# Patient Record
Sex: Female | Born: 1935 | Race: White | Hispanic: No | Smoking: Never smoker
Health system: Southern US, Community
[De-identification: ages and names within clinical notes are randomized; demographics above are authoritative.]

## PROBLEM LIST (undated history)

## (undated) DIAGNOSIS — K219 Gastro-esophageal reflux disease without esophagitis: Secondary | ICD-10-CM

## (undated) DIAGNOSIS — N289 Disorder of kidney and ureter, unspecified: Secondary | ICD-10-CM

## (undated) DIAGNOSIS — I10 Essential (primary) hypertension: Secondary | ICD-10-CM

## (undated) DIAGNOSIS — D649 Anemia, unspecified: Secondary | ICD-10-CM

## (undated) DIAGNOSIS — F419 Anxiety disorder, unspecified: Secondary | ICD-10-CM

## (undated) DIAGNOSIS — K2270 Barrett's esophagus without dysplasia: Secondary | ICD-10-CM

---

## 2003-06-15 ENCOUNTER — Other Ambulatory Visit: Payer: Self-pay

## 2005-03-11 ENCOUNTER — Ambulatory Visit: Payer: Self-pay | Admitting: Family Medicine

## 2007-07-24 ENCOUNTER — Emergency Department: Payer: Self-pay | Admitting: Internal Medicine

## 2007-07-24 ENCOUNTER — Other Ambulatory Visit: Payer: Self-pay

## 2007-07-30 ENCOUNTER — Inpatient Hospital Stay: Payer: Self-pay | Admitting: Specialist

## 2007-07-30 ENCOUNTER — Other Ambulatory Visit: Payer: Self-pay

## 2011-09-17 ENCOUNTER — Encounter: Payer: Self-pay | Admitting: Nurse Practitioner

## 2011-09-17 ENCOUNTER — Encounter: Payer: Self-pay | Admitting: Cardiothoracic Surgery

## 2012-02-27 DIAGNOSIS — F411 Generalized anxiety disorder: Secondary | ICD-10-CM

## 2012-02-27 DIAGNOSIS — K2270 Barrett's esophagus without dysplasia: Secondary | ICD-10-CM

## 2012-02-27 DIAGNOSIS — M159 Polyosteoarthritis, unspecified: Secondary | ICD-10-CM

## 2012-02-27 DIAGNOSIS — I10 Essential (primary) hypertension: Secondary | ICD-10-CM

## 2012-03-11 DIAGNOSIS — H6120 Impacted cerumen, unspecified ear: Secondary | ICD-10-CM

## 2012-03-23 ENCOUNTER — Telehealth: Payer: Self-pay

## 2012-03-23 NOTE — Telephone Encounter (Signed)
I reviewed this at [facility] this morning and this is appropriate Rx

## 2012-03-23 NOTE — Telephone Encounter (Signed)
Triage Record Num: [id number] Operator: Baltazar Apo Patient Name: Cotina Freedman Call Date & Time: 03/22/2012 3:42:39PM Patient Phone: [phone] PCP: Patient Gender: Female PCP Fax : Patient DOB: 1910/12/08 [facility] Name: [facility] Reason for Call: Caller: Betsy/RN; PCP: Ruthe Mannan (Family Practice); CB#: [phone]; Call regarding Requesting orders; Facility reporting UA results with > 100,000 bacteria present 12/1, Afebrile, Not reporting any urinary tract symptoms, Per standing orders Ceftin 250mg  1 PO BID x 7 days given to [facility], nurse at facility and cover with provider in the morning. Protocol(s) Used: Office Note Recommended Outcome per Protocol: Information Noted and Sent to Office Reason for Outcome: Caller information to office Care Advice: ~

## 2012-03-26 DIAGNOSIS — F411 Generalized anxiety disorder: Secondary | ICD-10-CM

## 2012-03-26 DIAGNOSIS — M159 Polyosteoarthritis, unspecified: Secondary | ICD-10-CM

## 2012-03-26 DIAGNOSIS — K2270 Barrett's esophagus without dysplasia: Secondary | ICD-10-CM

## 2012-03-26 DIAGNOSIS — I10 Essential (primary) hypertension: Secondary | ICD-10-CM

## 2012-03-30 DIAGNOSIS — R300 Dysuria: Secondary | ICD-10-CM

## 2012-04-30 DIAGNOSIS — K2270 Barrett's esophagus without dysplasia: Secondary | ICD-10-CM

## 2012-04-30 DIAGNOSIS — I10 Essential (primary) hypertension: Secondary | ICD-10-CM

## 2012-04-30 DIAGNOSIS — M159 Polyosteoarthritis, unspecified: Secondary | ICD-10-CM

## 2012-04-30 DIAGNOSIS — F411 Generalized anxiety disorder: Secondary | ICD-10-CM

## 2012-05-21 DIAGNOSIS — R350 Frequency of micturition: Secondary | ICD-10-CM

## 2012-05-22 DIAGNOSIS — N3000 Acute cystitis without hematuria: Secondary | ICD-10-CM

## 2012-05-28 DIAGNOSIS — M159 Polyosteoarthritis, unspecified: Secondary | ICD-10-CM

## 2012-05-28 DIAGNOSIS — F411 Generalized anxiety disorder: Secondary | ICD-10-CM

## 2012-05-28 DIAGNOSIS — N318 Other neuromuscular dysfunction of bladder: Secondary | ICD-10-CM

## 2012-05-28 DIAGNOSIS — I10 Essential (primary) hypertension: Secondary | ICD-10-CM

## 2012-06-18 DIAGNOSIS — M6281 Muscle weakness (generalized): Secondary | ICD-10-CM

## 2012-06-18 DIAGNOSIS — N318 Other neuromuscular dysfunction of bladder: Secondary | ICD-10-CM

## 2012-06-25 DIAGNOSIS — J069 Acute upper respiratory infection, unspecified: Secondary | ICD-10-CM

## 2012-07-30 DIAGNOSIS — N318 Other neuromuscular dysfunction of bladder: Secondary | ICD-10-CM

## 2012-07-30 DIAGNOSIS — K2270 Barrett's esophagus without dysplasia: Secondary | ICD-10-CM

## 2012-07-30 DIAGNOSIS — F411 Generalized anxiety disorder: Secondary | ICD-10-CM

## 2012-07-30 DIAGNOSIS — I10 Essential (primary) hypertension: Secondary | ICD-10-CM

## 2012-08-25 DIAGNOSIS — R3915 Urgency of urination: Secondary | ICD-10-CM

## 2012-08-28 DIAGNOSIS — F411 Generalized anxiety disorder: Secondary | ICD-10-CM

## 2012-09-23 ENCOUNTER — Ambulatory Visit: Payer: Self-pay | Admitting: Internal Medicine

## 2012-10-01 DIAGNOSIS — F411 Generalized anxiety disorder: Secondary | ICD-10-CM

## 2012-10-01 DIAGNOSIS — K2270 Barrett's esophagus without dysplasia: Secondary | ICD-10-CM

## 2012-10-01 DIAGNOSIS — N318 Other neuromuscular dysfunction of bladder: Secondary | ICD-10-CM

## 2012-10-01 DIAGNOSIS — I10 Essential (primary) hypertension: Secondary | ICD-10-CM

## 2012-10-19 DIAGNOSIS — L570 Actinic keratosis: Secondary | ICD-10-CM

## 2012-10-29 DIAGNOSIS — F329 Major depressive disorder, single episode, unspecified: Secondary | ICD-10-CM

## 2012-12-03 DIAGNOSIS — F411 Generalized anxiety disorder: Secondary | ICD-10-CM

## 2012-12-03 DIAGNOSIS — I10 Essential (primary) hypertension: Secondary | ICD-10-CM

## 2012-12-03 DIAGNOSIS — N318 Other neuromuscular dysfunction of bladder: Secondary | ICD-10-CM

## 2012-12-03 DIAGNOSIS — K2270 Barrett's esophagus without dysplasia: Secondary | ICD-10-CM

## 2012-12-22 DIAGNOSIS — F411 Generalized anxiety disorder: Secondary | ICD-10-CM

## 2012-12-22 DIAGNOSIS — R443 Hallucinations, unspecified: Secondary | ICD-10-CM

## 2013-01-20 DIAGNOSIS — F411 Generalized anxiety disorder: Secondary | ICD-10-CM

## 2013-01-20 DIAGNOSIS — N318 Other neuromuscular dysfunction of bladder: Secondary | ICD-10-CM

## 2013-01-20 DIAGNOSIS — M1990 Unspecified osteoarthritis, unspecified site: Secondary | ICD-10-CM

## 2013-01-20 DIAGNOSIS — K2270 Barrett's esophagus without dysplasia: Secondary | ICD-10-CM

## 2013-03-09 DIAGNOSIS — R443 Hallucinations, unspecified: Secondary | ICD-10-CM

## 2013-03-25 DIAGNOSIS — I10 Essential (primary) hypertension: Secondary | ICD-10-CM

## 2013-03-25 DIAGNOSIS — F411 Generalized anxiety disorder: Secondary | ICD-10-CM

## 2013-03-25 DIAGNOSIS — K2270 Barrett's esophagus without dysplasia: Secondary | ICD-10-CM

## 2013-03-25 DIAGNOSIS — N318 Other neuromuscular dysfunction of bladder: Secondary | ICD-10-CM

## 2013-05-25 DIAGNOSIS — IMO0002 Reserved for concepts with insufficient information to code with codable children: Secondary | ICD-10-CM

## 2013-05-25 DIAGNOSIS — I10 Essential (primary) hypertension: Secondary | ICD-10-CM

## 2013-05-25 DIAGNOSIS — F411 Generalized anxiety disorder: Secondary | ICD-10-CM

## 2013-05-25 DIAGNOSIS — N318 Other neuromuscular dysfunction of bladder: Secondary | ICD-10-CM

## 2013-05-25 DIAGNOSIS — M1710 Unilateral primary osteoarthritis, unspecified knee: Secondary | ICD-10-CM

## 2013-05-25 DIAGNOSIS — K2270 Barrett's esophagus without dysplasia: Secondary | ICD-10-CM

## 2013-07-26 DIAGNOSIS — N318 Other neuromuscular dysfunction of bladder: Secondary | ICD-10-CM

## 2013-07-26 DIAGNOSIS — F411 Generalized anxiety disorder: Secondary | ICD-10-CM

## 2013-07-26 DIAGNOSIS — I10 Essential (primary) hypertension: Secondary | ICD-10-CM

## 2013-07-26 DIAGNOSIS — K2270 Barrett's esophagus without dysplasia: Secondary | ICD-10-CM

## 2013-08-16 DIAGNOSIS — R443 Hallucinations, unspecified: Secondary | ICD-10-CM

## 2013-09-21 DIAGNOSIS — M1710 Unilateral primary osteoarthritis, unspecified knee: Secondary | ICD-10-CM

## 2013-09-21 DIAGNOSIS — IMO0002 Reserved for concepts with insufficient information to code with codable children: Secondary | ICD-10-CM

## 2013-09-21 DIAGNOSIS — M810 Age-related osteoporosis without current pathological fracture: Secondary | ICD-10-CM

## 2013-09-21 DIAGNOSIS — I10 Essential (primary) hypertension: Secondary | ICD-10-CM

## 2013-09-21 DIAGNOSIS — N318 Other neuromuscular dysfunction of bladder: Secondary | ICD-10-CM

## 2013-09-21 DIAGNOSIS — K2270 Barrett's esophagus without dysplasia: Secondary | ICD-10-CM

## 2013-09-21 DIAGNOSIS — F411 Generalized anxiety disorder: Secondary | ICD-10-CM

## 2013-10-18 DIAGNOSIS — R443 Hallucinations, unspecified: Secondary | ICD-10-CM

## 2013-11-07 ENCOUNTER — Observation Stay: Payer: Self-pay | Admitting: Orthopedic Surgery

## 2013-11-07 LAB — BASIC METABOLIC PANEL
Anion Gap: 6 — ABNORMAL LOW (ref 7–16)
BUN: 26 mg/dL — ABNORMAL HIGH (ref 7–18)
CREATININE: 1.37 mg/dL — AB (ref 0.60–1.30)
Calcium, Total: 8.3 mg/dL — ABNORMAL LOW (ref 8.5–10.1)
Chloride: 108 mmol/L — ABNORMAL HIGH (ref 98–107)
Co2: 27 mmol/L (ref 21–32)
EGFR (African American): 36 — ABNORMAL LOW
GFR CALC NON AF AMER: 31 — AB
GLUCOSE: 105 mg/dL — AB (ref 65–99)
Osmolality: 286 (ref 275–301)
Potassium: 5.1 mmol/L (ref 3.5–5.1)
Sodium: 141 mmol/L (ref 136–145)

## 2013-11-07 LAB — URINALYSIS, COMPLETE
Bilirubin,UR: NEGATIVE
Glucose,UR: NEGATIVE mg/dL (ref 0–75)
Ketone: NEGATIVE
Nitrite: NEGATIVE
PROTEIN: NEGATIVE
Ph: 6 (ref 4.5–8.0)
RBC,UR: 117 /HPF (ref 0–5)
SPECIFIC GRAVITY: 1.011 (ref 1.003–1.030)
SQUAMOUS EPITHELIAL: NONE SEEN

## 2013-11-07 LAB — CBC
HCT: 38.8 % — AB (ref 40.0–52.0)
HGB: 12.2 g/dL (ref 12.0–16.0)
MCH: 29 pg (ref 26.0–34.0)
MCHC: 31.5 g/dL — AB (ref 32.0–36.0)
MCV: 92 fL (ref 80–100)
PLATELETS: 127 10*3/uL — AB (ref 150–440)
RBC: 4.21 10*6/uL — ABNORMAL LOW (ref 4.40–5.90)
RDW: 14.7 % — AB (ref 11.5–14.5)
WBC: 7.8 10*3/uL (ref 3.8–10.6)

## 2013-11-08 LAB — BASIC METABOLIC PANEL
Anion Gap: 11 (ref 7–16)
BUN: 30 mg/dL — AB (ref 7–18)
CALCIUM: 8.9 mg/dL (ref 8.5–10.1)
Chloride: 107 mmol/L (ref 98–107)
Co2: 24 mmol/L (ref 21–32)
Creatinine: 1.4 mg/dL — ABNORMAL HIGH (ref 0.60–1.30)
EGFR (Non-African Amer.): 31 — ABNORMAL LOW
GFR CALC AF AMER: 35 — AB
GLUCOSE: 137 mg/dL — AB (ref 65–99)
OSMOLALITY: 291 (ref 275–301)
POTASSIUM: 4.4 mmol/L (ref 3.5–5.1)
Sodium: 142 mmol/L (ref 136–145)

## 2013-11-10 LAB — URINE CULTURE

## 2013-11-11 DIAGNOSIS — S82899A Other fracture of unspecified lower leg, initial encounter for closed fracture: Secondary | ICD-10-CM

## 2013-11-19 DIAGNOSIS — T783XXA Angioneurotic edema, initial encounter: Secondary | ICD-10-CM

## 2013-11-23 DIAGNOSIS — K2270 Barrett's esophagus without dysplasia: Secondary | ICD-10-CM

## 2013-11-23 DIAGNOSIS — R221 Localized swelling, mass and lump, neck: Secondary | ICD-10-CM

## 2013-11-23 DIAGNOSIS — F411 Generalized anxiety disorder: Secondary | ICD-10-CM

## 2013-11-23 DIAGNOSIS — M1710 Unilateral primary osteoarthritis, unspecified knee: Secondary | ICD-10-CM

## 2013-11-23 DIAGNOSIS — R443 Hallucinations, unspecified: Secondary | ICD-10-CM

## 2013-11-23 DIAGNOSIS — R220 Localized swelling, mass and lump, head: Secondary | ICD-10-CM

## 2013-11-23 DIAGNOSIS — IMO0002 Reserved for concepts with insufficient information to code with codable children: Secondary | ICD-10-CM

## 2013-11-23 DIAGNOSIS — I10 Essential (primary) hypertension: Secondary | ICD-10-CM

## 2013-12-21 DIAGNOSIS — F411 Generalized anxiety disorder: Secondary | ICD-10-CM

## 2013-12-21 DIAGNOSIS — F22 Delusional disorders: Secondary | ICD-10-CM

## 2014-01-20 DIAGNOSIS — S82102S Unspecified fracture of upper end of left tibia, sequela: Secondary | ICD-10-CM

## 2014-01-20 DIAGNOSIS — F419 Anxiety disorder, unspecified: Secondary | ICD-10-CM

## 2014-01-20 DIAGNOSIS — K2270 Barrett's esophagus without dysplasia: Secondary | ICD-10-CM

## 2014-01-20 DIAGNOSIS — F29 Unspecified psychosis not due to a substance or known physiological condition: Secondary | ICD-10-CM

## 2014-01-20 DIAGNOSIS — I10 Essential (primary) hypertension: Secondary | ICD-10-CM

## 2014-01-20 DIAGNOSIS — M150 Primary generalized (osteo)arthritis: Secondary | ICD-10-CM

## 2014-03-23 DIAGNOSIS — F419 Anxiety disorder, unspecified: Secondary | ICD-10-CM

## 2014-03-23 DIAGNOSIS — K2270 Barrett's esophagus without dysplasia: Secondary | ICD-10-CM

## 2014-03-23 DIAGNOSIS — S82302A Unspecified fracture of lower end of left tibia, initial encounter for closed fracture: Secondary | ICD-10-CM

## 2014-03-23 DIAGNOSIS — I10 Essential (primary) hypertension: Secondary | ICD-10-CM

## 2014-03-23 DIAGNOSIS — F600 Paranoid personality disorder: Secondary | ICD-10-CM

## 2014-03-23 DIAGNOSIS — M1990 Unspecified osteoarthritis, unspecified site: Secondary | ICD-10-CM

## 2014-05-03 DIAGNOSIS — L858 Other specified epidermal thickening: Secondary | ICD-10-CM

## 2014-05-30 DIAGNOSIS — M150 Primary generalized (osteo)arthritis: Secondary | ICD-10-CM

## 2014-05-30 DIAGNOSIS — F411 Generalized anxiety disorder: Secondary | ICD-10-CM

## 2014-05-30 DIAGNOSIS — K2270 Barrett's esophagus without dysplasia: Secondary | ICD-10-CM

## 2014-05-30 DIAGNOSIS — F29 Unspecified psychosis not due to a substance or known physiological condition: Secondary | ICD-10-CM

## 2014-05-30 DIAGNOSIS — I10 Essential (primary) hypertension: Secondary | ICD-10-CM

## 2014-06-21 DIAGNOSIS — L03019 Cellulitis of unspecified finger: Secondary | ICD-10-CM | POA: Diagnosis not present

## 2014-06-29 DIAGNOSIS — L03019 Cellulitis of unspecified finger: Secondary | ICD-10-CM | POA: Diagnosis not present

## 2014-06-30 DIAGNOSIS — T7840XA Allergy, unspecified, initial encounter: Secondary | ICD-10-CM | POA: Diagnosis not present

## 2014-07-26 DIAGNOSIS — M1990 Unspecified osteoarthritis, unspecified site: Secondary | ICD-10-CM | POA: Diagnosis not present

## 2014-07-26 DIAGNOSIS — I10 Essential (primary) hypertension: Secondary | ICD-10-CM | POA: Diagnosis not present

## 2014-07-26 DIAGNOSIS — K219 Gastro-esophageal reflux disease without esophagitis: Secondary | ICD-10-CM | POA: Diagnosis not present

## 2014-07-26 DIAGNOSIS — F600 Paranoid personality disorder: Secondary | ICD-10-CM | POA: Diagnosis not present

## 2014-07-26 DIAGNOSIS — F419 Anxiety disorder, unspecified: Secondary | ICD-10-CM

## 2014-08-04 DIAGNOSIS — L03114 Cellulitis of left upper limb: Secondary | ICD-10-CM | POA: Diagnosis not present

## 2014-08-13 NOTE — Discharge Summary (Signed)
PATIENT NAME:  Vanessa Glass, Vanessa Glass MR#:  [id number] DATE OF BIRTH:  1910/07/31  DATE OF ADMISSION:  11/07/2013 DATE OF DISCHARGE:    REASON FOR ADMISSION:  Left bimalleolar ankle fracture.    HISTORY OF PRESENT ILLNESS:  The patient is a 81 year old female who fell at [facility] which is her home.  The fall was reported to have recurred the morning of admission.  The patient was unable to stand after injury.  She was seen in the ER with her daughter and son-in-law.  The patient denies other injuries.  She is hypertensive and her daughter reports that she had not taken her regular medications on admission.   PAST MEDICAL HISTORY:  Includes macular degeneration, hypertension, hypercholesterolemia, GERD, appendectomy and cholecystectomy.   ALLERGIES:  PENICILLIN, SULFA AND MACRODANTIN.   HOME MEDICATIONS:  Include Cipro, MiraLAX, vitamin C, Celexa, atenolol, Centrum Silver, vitamin D3, enteric-coated aspirin 81 mg, Timolol ophthalmic 0.5 ophthalmic solution, Seroquel 25 mg, Protonix 40 mg, PreserVision antioxidant multivitamins, Tylenol Arthritis, Xanax, Xalatan ophthalmic solution, Debrox, Allegra, artificial tears.  FAMILY HISTORY:  Noncontributory.   HOSPITAL COURSE:  The patient was admitted on 11/07/2013 after a successful closed reduction and splinting of the left ankle.  The patient was admitted for neurovascular monitoring.  The patient was noted to have systolic blood pressure of over 200 in the ER.  Hospitalist service was consulted.  They recommended the patient get back on her home medications for hypertension.  On hospital day #1, the patient was vomiting.  She had foul-smelling urine and was found to have a UTI.  The patient was started on Levaquin by the hospitalist service.  The patient got a KUB for the nausea and vomiting and felt to have possible ileus and was given an NG tube.  She was kept nothing by mouth.  By hospital day 2, the patient was no longer vomiting.  The NG tube was  removed.  She had a bowel movement and was found to have a better appearance on her KUB by the medical service.  The patient had a Foley placed and as she was having trouble voiding the medical service recommended that she keep an indwelling catheter.  The patient was also requiring supplemental O2 with nasal cannula.  The medical service recommended that she continue using this while at her facility to keep saturations greater than 88%.  In regards to her balance, the medical service recommended the patient be discharged on bowel regimen she was currently and have a gentle diet as she can tolerate.  The patient was also seen by palliative care.  The patient is a DO NOT RESUSCITATE status.   DISCHARGE INSTRUCTIONS:  The patient will be discharged with instructions to continue to elevate the left lower extremity.  She may be touchdown weight-bearing only on the left lower extremity.  She needs to use a walker for transfers and should primarily use wheelchair for transportation.  She will require assistance with transfers.  The patient will follow up with [facility] in 7 to 10 days for skin check and conversion from a splint to a short leg cast.  She may make a follow up with her primary care physician regarding her constipation and recent UTI.  Given the patient's age and high risk falling, I am not recommending that she be placed on an anticoagulant.  She also is not requiring any narcotics for pain and may use Tylenol.   DISCHARGE DIAGNOSES: 1.  Left bimalleolar ankle fracture.  2.  Urinary tract infection.  3.  Malignant hypertension.  4.  Constipation.    ____________________________ Kathreen DevoidKevin L. Mckennah Kretchmer, MD klk:ea D: 11/09/2013 17:15:36 ET T: 11/09/2013 17:39:43 ET JOB#: [id number]  cc: Kathreen DevoidKevin L. Munir Victorian, MD, <Dictator> Kathreen DevoidKEVIN L Sumayah Bearse MD ELECTRONICALLY SIGNED 11/24/2013 11:01

## 2014-08-13 NOTE — Consult Note (Signed)
PATIENT NAME:  Vanessa Glass, Vanessa Glass DATE OF BIRTH:  16-Mar-1911  DATE OF CONSULTATION:  11/07/2013  CONSULTING PHYSICIAN:  Christeena Krogh J. Cherlynn KaiserSainani, MD  CONSULTING PHYSICIAN: Dr. Juanell FairlyKevin Krasinski.  PRIMARY CARE PHYSICIAN: Dr. Alphonsus SiasLetvak.  REASON FOR CONSULTATION: Uncontrolled hypertension and medical management.  HISTORY OF PRESENT ILLNESS: This is a 81 year old female who presented to the Emergency Room today after a fall at [facility] and noted to have a left ankle fracture. The patient had a cast done to her left ankle and was admitted on the orthopedic service. Her systolic blood pressures were noted to be as high as 190 to 200 and hospitalist services were contacted for consultation. The patient denies any chest pain, shortness of breath, headache, blurry vision, nausea, vomiting, abdominal pain, or any other associated symptoms presently.   REVIEW OF SYSTEMS:  CONSTITUTIONAL: No documented fever. No weight gain or weight loss.  EYES: No blurry or double vision.  ENT: No tinnitus. No postnasal drip. No redness of the oropharynx.  RESPIRATORY: No cough, no wheeze, no hemoptysis, no dyspnea.  CARDIOVASCULAR: No chest pain, no orthopnea, no palpitations, no syncope.  GASTROINTESTINAL: No nausea, no vomiting, no diarrhea. No abdominal pain. No melena or hematochezia.  GENITOURINARY: No dysuria and hematuria.  ENDOCRINE: Positive nocturia or heat of cold intolerance.  HEMATOLOGIC: No anemia, no bruising, no bleeding.  INTEGUMENTARY: No rashes or lesions.  MUSCULOSKELETAL: No arthritis, no swelling, no gout.  NEUROLOGIC: No numbness or tingling. No ataxia. No seizures type activity. PSYCHIATRIC: No anxiety. No insomnia. No ADD.   PAST MEDICAL HISTORY: Consistent with hypertension, hyperlipidemia, history of GERD. History of glaucoma, anxiety/depression.   ALLERGIES: TO MACRODANTIN, PENICILLIN, AND SULFA DRUGS.   SOCIAL HISTORY: Does have a 30 to 40-pack-year  smoking history but quit about 30 years ago. Occasional alcohol use. No illicit drug abuse. [facility].   FAMILY HISTORY: The patient's mother and father are both deceased. The patient's mother died from cancer of unknown type. Father died from old age.   CURRENT MEDICATIONS: As follows: Allegra 180 mg daily as needed. Artificial tears 2 drops to the affected eye every 2 hours as needed, aspirin 81 mg daily, atenolol 25 mg daily, Celexa 20 mg daily, Centrum multivitamin daily, Debrox solution 5-10 drops to the affected ear twice daily as needed, MiraLax daily as needed, PreserVision, multivitamin 1 tab b.i.d., Protonix 40 mg b.i.d., Seroquel 25 mg b.i.d., timolol 0.5% ophthalmic solution 2 drops to affected eye daily. Tylenol arthritis 1 cap t.i.d., vitamin C 1000 mg daily, vitamin D3 at 1000 international units daily, Xalatan 0.005% ophthalmic solution 2 drops to the affected eye at bedtime.   PHYSICAL EXAMINATION:  VITALS SIGNS: Presently is as follows: Vital signs are noted to be: Temperature 97.6, pulse 70, respirations 18, blood pressure 204/79, sats 92% on room air.  GENERAL: She is a pleasant-appearing female in no apparent distress.  HEAD, EYES, EARS, NOSE AND THROAT: Atraumatic, normocephalic. Extraocular muscles are intact. Pupils are equal and reactive to light. Sclerae is anicteric. No conjunctival injection. No pharyngeal erythema.  NECK: Supple. There is no jugular venous distention. No bruits. No lymphadenopathy or thyromegaly.  HEART: Regular rate and rhythm. No murmurs, no rubs, no clicks.  LUNGS: Clear to auscultation anteriorly. No rales, no rhonchi, no wheezes.  ABDOMEN: Soft, flat, nontender, nondistended. Has good bowel sounds. No hepatosplenomegaly or obesity.  EXTREMITIES: No evidence of cyanosis, clubbing, or peripheral edema.  Her left lower extremity is in a cast, +2 radial pulses on the right and also +2 pedal pulses on the  right.  NEUROLOGIC: She is alert, awake, and oriented x 3 with no focal motor or sensory deficits appreciated bilaterally.  SKIN: Moist and warm with no rashes.  LYMPHATIC: There is no cervical, axiallary lymphadenopathy.   LABORATORY DATA: Showed a serum glucose of 105, BUN 26, creatinine 1.3, sodium 141, potassium 5.1, chloride 108, bicarbonate 27. The patient's white cell count is 7.8, hemoglobin 12.2, hematocrit 38.8, platelet count of 127,000.   The patient did have a CT of the head done without contrast, which showed no acute intracranial process. The patient also had a CT cervical spine done without contrast, which showed no evidence of cervical spine fracture, or degenerative changes.   The patient also had a left ankle x-ray which showed a displaced medial malleolare and distal fibula fractures with lateral talar subluxation.   ASSESSMENT AND PLAN: This is a 81 year old female with a history of hypertension, hyperlipidemia, glaucoma, anxiety/depression, gastroesophageal reflux disease, chronic kidney disease stage III who presented to the hospital after a fall and noted to have a left ankle fracture, also noted to be severely hypertensive.  1. Malignant hypertension. This is likely secondary to fact that the patient did not take her oral meds this morning complicated with underlying recent ankle fracture and pain control related. For now, I will treat her underlying pain, give her IV hydralazine. Also place her on oral atenolol and follow her hemodynamics. If needed, we will add additional antihypertensives.  2. Left ankle fracture. Patient's left ankle is in a cast now. Continue pain control and care as per orthopedics.  3. Glaucoma. Continue with her timolol and latanoprost eye drops.  4. Anxiety/depression. Continue Xanax, Celexa, and Seroquel.  5. Gastroesophageal reflux disease. Continue Protonix.   CODE STATUS: The patient is a full code.  Thank you so much for the consultation.  We will follow along with you.   TIME SPENT WITH THE CONSULT: 50 minutes.    ____________________________ Rolly Pancake. Cherlynn Kaiser, MD vjs:lt D: 11/07/2013 17:40:05 ET T: 11/07/2013 21:33:52 ET JOB#: [id number]  cc: Rolly Pancake. Cherlynn Kaiser, MD, <Dictator> Houston Siren MD ELECTRONICALLY SIGNED 11/18/2013 14:05

## 2014-08-13 NOTE — Consult Note (Signed)
Brief Consult Note: Diagnosis: 1. Malignant HTN 2. Left Ankle Fracture 3. Glaucoma 4. Anxiety/Depression 5. GERD.   Patient was seen by consultant.   Orders entered.   Comments: [age] yo female w/ hx of HTN, Hyperlipidemia, Glaucoma, Anxiety/Depression, GERD, CKD Stage III who presented to hospital w/ a fall and noted to have a left ankle fracture.  also noted to be severly hypertensive.   1. Malignant HTN - likely due to pain from recent fracture and also because pt. has not taken her meds today.  - will cont. ATenolol and add PRN hydralazine and monitor.  - treat underlying pain due to fracture.   2. Left ankle Fracture - in a cast now.  cont. care as per Ortho.   3. Glaucoma - cont. Timolol, Latanoprost eye drops.   4. Anxiety/Depression - cont. Xanax, Celexa/Seroquel.   5. GERD - cont. protonix.   Full Code Thanks for the consult and will follow with you.   Job #  [id number].  Electronic Signatures: Houston SirenSainani, Wanda Cellucci J (MD)  (Signed 19-Jul-15 17:40)  Authored: Brief Consult Note   Last Updated: 19-Jul-15 17:40 by Houston SirenSainani, Shaquan Puerta J (MD)

## 2014-08-13 NOTE — H&P (Signed)
Subjective/Chief Complaint Left ankle injury   History of Present Illness 81 year old female fell at home at [facility].  Fall reported to have occurred this AM.  Patient unable to stand after the injury.  Patient is seen in the ER with her daughter and son-in-law.  Patient denies other injuries.  She is hypertensive, but daughter reports she has not taken her regular medications today including her anti-hypertensive.   Past Med/Surgical Hx:  Macular Degeneration:   hypertension:   Hypercholesterolemia:   GERD - Esophageal Reflux:   appendectomy:   Cholecystectomy:   ALLERGIES:  PCN: Rash  Sulfa drugs: Rash  Macrodantin: Hives  HOME MEDICATIONS: Medication Instructions Status  Cipro 500 mg oral tablet 1 tab(s) PO 2 times a day x 7 days  Active  MiraLax - oral powder for reconstitution 17 gram(s) orally once a day Active  Vitamin C 1000 mg oral tablet 1 tab(s) orally Monday, Wednesday, and Friday Active  CeleXA 20 mg oral tablet 1 tab(s) orally once a day Active  atenolol 25 mg oral tablet 1 tab(s) orally once a day Active  Centrum Silver Therapeutic Multiple Vitamins with Minerals oral tablet 1 tab(s) orally once a day Active  Vitamin D3 1000 intl units oral capsule 1 cap(s) orally once a day Active  Aspirin Enteric Coated 81 mg oral delayed release tablet 1 tab(s) orally once a day Active  timolol ophthalmic 0.5% ophthalmic solution 2 drop(s) to each affected eye once a day wait 3 mins in between drops  Active  SEROquel 25 mg oral tablet 1 tab(s) orally 2 times a day Active  Protonix 40 mg oral delayed release tablet 1 tab(s) orally 2 times a day Active  PreserVision Antioxidant Multiple Vitamins and Minerals oral capsule 1 cap(s) orally 2 times a day Active  Tylenol Arthritis Caplet 1 tab(s) orally 3 times a day Active  Xanax 1 mg oral tablet 0.5 tab(s) orally 4 times a day Active  Xalatan 0.005% ophthalmic solution 2 drop(s) to each affected eye once a day (at bedtime) wait 3  mins in between drops Active  Debrox 6.5% otic solution 5-10 drop(s) to each affected ear 2 times a day, As Needed Active  Allegra 180 mg oral tablet 1 tab(s) orally once a day, As Needed Active  Artificial Tears preserved ophthalmic solution 2 drop(s) to each affected eye every 2 hours, As Needed Active   Family and Social History:  Family History Non-Contributory   Place of Living Home   Review of Systems:  Subjective/Chief Complaint Left ankle pain   Physical Exam:  GEN no acute distress   HEENT PERRL, Oropharynx clear, Hard of hearing.  Only can hear in left ear.   NECK supple  trachea midline   RESP normal resp effort  clear BS  no use of accessory muscles   CARD regular rate  no murmur  no JVD  no Rub   ABD denies tenderness  soft  normal BS  no Adominal Mass   LYMPH negative neck   EXTR Left ankle with significant swelling and anterior fracture blister.  Significant ecchymosis circumferentially around ankle.  Toes are well perfused, although pedal pulses are difficult to palpate due to swelling.  She can actively flex and extend toes without pain and can raise up her lower leg without pain.   NEURO motor/sensory function intact   PSYCH alert   Lab Results: Routine Chem:  19-Jul-15 11:54   BUN  26  Creatinine (comp)  1.37  Sodium, Serum 141  Potassium, Serum 5.1  Chloride, Serum  108  CO2, Serum 27  Calcium (Total), Serum  8.3  Anion Gap  6  Osmolality (calc) 286  eGFR (African American)  36  eGFR (Non-African American)  31 (eGFR values <12m/min/1.73 m2 may be an indication of chronic kidney disease (CKD). Calculated eGFR is useful in patients with stable renal function. The eGFR calculation will not be reliable in acutely ill patients when serum creatinine is changing rapidly. It is not useful in  patients on dialysis. The eGFR calculation may not be applicable to patients at the low and high extremes of body sizes, pregnant women, and vegetarians.)   Routine UA:  19-Jul-15 13:21   Color (UA) Yellow  Clarity (UA) Clear  Glucose (UA) Negative  Bilirubin (UA) Negative  Ketones (UA) Negative  Specific Gravity (UA) 1.011  Blood (UA) 2+  pH (UA) 6.0  Protein (UA) Negative  Nitrite (UA) Negative  Leukocyte Esterase (UA) Trace (Result(s) reported on 07 Nov 2013 at 01:22PM.)  RBC (UA) 117 /HPF  WBC (UA) 10 /HPF  Bacteria (UA) 3+  Epithelial Cells (UA) NONE SEEN  Result(s) reported on 07 Nov 2013 at 01:22PM.  Routine Hem:  19-Jul-15 11:54   WBC (CBC) 7.8  RBC (CBC)  4.21  Hemoglobin (CBC) 12.2  Hematocrit (CBC)  38.8  Platelet Count (CBC)  127 (Result(s) reported on 07 Nov 2013 at 12:12PM.)  MCV 92  MCH 29.0  MCHC  31.5  RDW  14.7   Radiology Results: XRay:    19-Jul-15 12:16, Ankle Left Complete  Ankle Left Complete  REASON FOR EXAM:    DEFORMITY  COMMENTS:       PROCEDURE: DXR - DXR ANKLE LEFT COMPLETE  - Nov 07 2013 12:16PM     CLINICAL DATA:  Tripped and fell.  Left ankle pain and deformity.    EXAM:  LEFT ANKLE COMPLETE - 3+ VIEW    COMPARISON:  None.    FINDINGS:  There fractures of the medial laterally lie. There are 2 oblique  fracture lines crossing the medial malleolus, 1 from the medial  metaphysis to the medial articular surface and the other more  transverse across the base of the malleolus. Thereis an oblique  fracture of the distal fibula across the metaphysis. The talus and  fractured distal medial malleolar and fibular components are  subluxed laterally by approximately 8 mm with corresponding lateral  displacement of the fracture fragments.    No other fractures. Bones are diffusely demineralized. There is  diffuse soft tissue swelling.     IMPRESSION:  Displaced medial malleolar and distal fibular fractures with lateral  talar subluxation.    Electronically Signed    By: DLajean ManesM.D.    On: 11/07/2013 12:28         Verified By: DLasandra Beech M.D.,    19-Jul-15 16:26, Ankle  Left Complete  Ankle Left Complete  REASON FOR EXAM:    post-reduction  COMMENTS:   Bedside (portable):Y    PROCEDURE: DXR - DXR ANKLE LEFT COMPLETE  - Nov 07 2013  4:26PM     CLINICAL DATA:  Post reduction bimalleolar left ankle fracture.    EXAM:  LEFT ANKLE COMPLETE - 3+ VIEW 1617 hr    COMPARISON:  Left ankle x-rays earlier same date 1204 hr.    FINDINGS:  Examination was performed in fiberglass cast material. Significant  improvement in alignment of the fractures involving the distal  fibula and  the medial malleolus. Ankle mortise now intact with  anatomic alignment.     IMPRESSION:  Significant improvement in alignment of the bimalleolar fractures  post reduction.      Electronically Signed    By: Evangeline Dakin M.D.    On: 11/07/2013 16:49         Verified By: Deniece Portela, M.D.,  [place]:    19-Jul-15 12:16, Ankle Left Complete  PACS Image    19-Jul-15 13:12, CT Cervical Spine Without Contrast  PACS Image  CT:  CT Cervical Spine Without Contrast  REASON FOR EXAM:    FALL, ELDERLY WITH HEAD INJURY  COMMENTS:       PROCEDURE: CT  - CT CERVICAL SPINE WO  - Nov 07 2013  1:12PM     CLINICAL DATA:  Fall with head injury.    EXAM:  CT HEAD WITHOUT CONTRAST    CT CERVICAL SPINE WITHOUT CONTRAST    TECHNIQUE:  Multidetector CT imaging of the head and cervical spine was  performed following the standard protocol without intravenous  contrast. Multiplanar CT image reconstructions of the cervical spine  were also generated.    COMPARISON:  None.    FINDINGS:  CT HEAD FINDINGS    The brain shows evidence of age appropriate atrophy and mild to  moderate small vessel disease in the periventricular white matter.  The brain demonstrates no evidence of hemorrhage, infarction, edema,  mass effect, extra-axial fluid collection, hydrocephalus or mass  lesion. The skull is unremarkable.    CT CERVICAL SPINE FINDINGS  The cervical spine shows normal  alignment. There is no evidence of  acute fracture or subluxation. No soft tissue swelling or hematoma  is identified. The cervical spine shows mild to moderate spondylosis  with a mild anterolisthesis of C5 on C6. There is underlying  osteopenia. No bony or soft tissue lesions are seen. The visualized  airway is normally patent.     IMPRESSION:  1. Atrophy and small vessel disease of the brain. No acute findings  by head CT.  2. No evidence of cervical spine fracture. Degenerative changes are  present throughout the cervical spine.      Electronically Signed    By: Aletta Edouard M.D.    On: 11/07/2013 13:39         Verified By: Azzie Roup, M.D.,    19-Jul-15 13:12, CT Head Without Contrast  CT Head Without Contrast  REASON FOR EXAM:    FALL, KNOT ON HEAD  COMMENTS:       PROCEDURE: CT  - CT HEAD WITHOUT CONTRAST  - Nov 07 2013  1:12PM     CLINICAL DATA:  Fall with head injury.    EXAM:  CT HEAD WITHOUT CONTRAST    CT CERVICAL SPINE WITHOUT CONTRAST    TECHNIQUE:  Multidetector CT imaging of the head and cervical spine was  performed following the standard protocol without intravenous  contrast. Multiplanar CT image reconstructions of the cervical spine  were also generated.    COMPARISON:  None.    FINDINGS:  CT HEAD FINDINGS    The brain shows evidence of age appropriate atrophy and mild to  moderate small vessel disease in the periventricular white matter.  The brain demonstrates no evidence of hemorrhage, infarction, edema,  mass effect, extra-axial fluid collection, hydrocephalus or mass  lesion. The skull is unremarkable.    CT CERVICAL SPINE FINDINGS  The cervical spine shows normal alignment. There is no evidence of  acute fracture or subluxation. No soft tissue swelling or hematoma  is identified. The cervical spine shows mild to moderate spondylosis  with a mild anterolisthesis of C5 on C6. There is underlying  osteopenia. No bony or soft  tissue lesions are seen. The visualized  airway is normally patent.     IMPRESSION:  1. Atrophy and small vessel disease of the brain. No acute findings  by head CT.  2. No evidence of cervical spine fracture. Degenerative changes are  present throughout the cervical spine.      Electronically Signed    By: Aletta Edouard M.D.    On: 11/07/2013 13:39         Verified By: Azzie Roup, M.D.,    Assessment/Admission Diagnosis Bimalleolar ankle fracture with lateral talar subluxation   Plan I spoke with the patient and her family.  I showed them the xrays.  I have recommended a closed reduction with an ankle block.  They argreed to this procedure.  Under sterile conditions, I injected a 50:50 10 cc mixture of  1% lidocaine plain with 0.5% lidocaine into the anterior ankle joint.  The patient was then wrapped with webril over the lower leg.  A short leg splint was then applied with a varus mold after a closed reduction was performed.  Post-reduction xrays showed anatomic alignment, with a symmetric mortise and no widening of the syndesmosis.  I will admit the patient for neurovascular monitoring.  She willl have a PT/OT evaluation in the morning and will need a case management consult for services at [facility] prior to discharge.  She had a systolic blood pressure over 200 in the ER.  I spoke with Dr. Verdell Carmine, the hospitalist who will consult to help manage her hypertension.  The patient and family understood and agreed with this plan.   Electronic Signatures: Thornton Park (MD)  (Signed 19-Jul-15 18:34)  Authored: CHIEF COMPLAINT and HISTORY, PAST MEDICAL/SURGIAL HISTORY, ALLERGIES, HOME MEDICATIONS, FAMILY AND SOCIAL HISTORY, REVIEW OF SYSTEMS, PHYSICAL EXAM, LABS, Radiology, ASSESSMENT AND PLAN   Last Updated: 19-Jul-15 18:34 by Thornton Park (MD)

## 2014-09-22 DIAGNOSIS — F411 Generalized anxiety disorder: Secondary | ICD-10-CM

## 2014-09-22 DIAGNOSIS — K2270 Barrett's esophagus without dysplasia: Secondary | ICD-10-CM | POA: Diagnosis not present

## 2014-09-22 DIAGNOSIS — I10 Essential (primary) hypertension: Secondary | ICD-10-CM | POA: Diagnosis not present

## 2014-09-22 DIAGNOSIS — F29 Unspecified psychosis not due to a substance or known physiological condition: Secondary | ICD-10-CM | POA: Diagnosis not present

## 2014-09-22 DIAGNOSIS — M150 Primary generalized (osteo)arthritis: Secondary | ICD-10-CM | POA: Diagnosis not present

## 2014-10-20 DIAGNOSIS — L03011 Cellulitis of right finger: Secondary | ICD-10-CM | POA: Diagnosis not present

## 2014-11-16 DIAGNOSIS — L989 Disorder of the skin and subcutaneous tissue, unspecified: Secondary | ICD-10-CM | POA: Diagnosis not present

## 2014-11-21 DIAGNOSIS — L0100 Impetigo, unspecified: Secondary | ICD-10-CM | POA: Diagnosis not present

## 2015-01-24 DIAGNOSIS — L570 Actinic keratosis: Secondary | ICD-10-CM

## 2015-01-25 DIAGNOSIS — M1990 Unspecified osteoarthritis, unspecified site: Secondary | ICD-10-CM | POA: Diagnosis not present

## 2015-01-25 DIAGNOSIS — F419 Anxiety disorder, unspecified: Secondary | ICD-10-CM

## 2015-01-25 DIAGNOSIS — F22 Delusional disorders: Secondary | ICD-10-CM | POA: Diagnosis not present

## 2015-01-25 DIAGNOSIS — I10 Essential (primary) hypertension: Secondary | ICD-10-CM | POA: Diagnosis not present

## 2015-01-25 DIAGNOSIS — K2270 Barrett's esophagus without dysplasia: Secondary | ICD-10-CM | POA: Diagnosis not present

## 2015-03-27 DIAGNOSIS — I10 Essential (primary) hypertension: Secondary | ICD-10-CM | POA: Diagnosis not present

## 2015-03-27 DIAGNOSIS — F29 Unspecified psychosis not due to a substance or known physiological condition: Secondary | ICD-10-CM

## 2015-03-27 DIAGNOSIS — K2270 Barrett's esophagus without dysplasia: Secondary | ICD-10-CM | POA: Diagnosis not present

## 2015-03-27 DIAGNOSIS — I480 Paroxysmal atrial fibrillation: Secondary | ICD-10-CM | POA: Diagnosis not present

## 2015-03-27 DIAGNOSIS — M159 Polyosteoarthritis, unspecified: Secondary | ICD-10-CM | POA: Diagnosis not present

## 2015-03-27 DIAGNOSIS — F39 Unspecified mood [affective] disorder: Secondary | ICD-10-CM

## 2015-05-05 DIAGNOSIS — B351 Tinea unguium: Secondary | ICD-10-CM

## 2015-05-24 DIAGNOSIS — M1990 Unspecified osteoarthritis, unspecified site: Secondary | ICD-10-CM | POA: Diagnosis not present

## 2015-05-24 DIAGNOSIS — K219 Gastro-esophageal reflux disease without esophagitis: Secondary | ICD-10-CM

## 2015-05-24 DIAGNOSIS — I10 Essential (primary) hypertension: Secondary | ICD-10-CM

## 2015-05-24 DIAGNOSIS — F39 Unspecified mood [affective] disorder: Secondary | ICD-10-CM

## 2015-05-24 DIAGNOSIS — I4891 Unspecified atrial fibrillation: Secondary | ICD-10-CM | POA: Diagnosis not present

## 2015-05-24 DIAGNOSIS — F22 Delusional disorders: Secondary | ICD-10-CM

## 2015-05-24 DIAGNOSIS — K2270 Barrett's esophagus without dysplasia: Secondary | ICD-10-CM

## 2015-06-26 DIAGNOSIS — J9600 Acute respiratory failure, unspecified whether with hypoxia or hypercapnia: Secondary | ICD-10-CM | POA: Diagnosis not present

## 2015-07-03 DIAGNOSIS — R05 Cough: Secondary | ICD-10-CM | POA: Diagnosis not present

## 2015-07-03 DIAGNOSIS — J690 Pneumonitis due to inhalation of food and vomit: Secondary | ICD-10-CM | POA: Diagnosis not present

## 2015-07-24 DIAGNOSIS — F29 Unspecified psychosis not due to a substance or known physiological condition: Secondary | ICD-10-CM

## 2015-07-24 DIAGNOSIS — K2270 Barrett's esophagus without dysplasia: Secondary | ICD-10-CM | POA: Diagnosis not present

## 2015-07-24 DIAGNOSIS — F39 Unspecified mood [affective] disorder: Secondary | ICD-10-CM | POA: Diagnosis not present

## 2015-07-24 DIAGNOSIS — M1612 Unilateral primary osteoarthritis, left hip: Secondary | ICD-10-CM | POA: Diagnosis not present

## 2015-07-24 DIAGNOSIS — I480 Paroxysmal atrial fibrillation: Secondary | ICD-10-CM | POA: Diagnosis not present

## 2015-08-15 DIAGNOSIS — L989 Disorder of the skin and subcutaneous tissue, unspecified: Secondary | ICD-10-CM | POA: Diagnosis not present

## 2015-09-29 DIAGNOSIS — F29 Unspecified psychosis not due to a substance or known physiological condition: Secondary | ICD-10-CM | POA: Diagnosis not present

## 2015-09-29 DIAGNOSIS — I4891 Unspecified atrial fibrillation: Secondary | ICD-10-CM | POA: Diagnosis not present

## 2015-09-29 DIAGNOSIS — M1990 Unspecified osteoarthritis, unspecified site: Secondary | ICD-10-CM | POA: Diagnosis not present

## 2015-09-29 DIAGNOSIS — K2270 Barrett's esophagus without dysplasia: Secondary | ICD-10-CM | POA: Diagnosis not present

## 2015-09-29 DIAGNOSIS — F39 Unspecified mood [affective] disorder: Secondary | ICD-10-CM

## 2015-11-23 DIAGNOSIS — K2270 Barrett's esophagus without dysplasia: Secondary | ICD-10-CM | POA: Diagnosis not present

## 2015-11-23 DIAGNOSIS — I480 Paroxysmal atrial fibrillation: Secondary | ICD-10-CM | POA: Diagnosis not present

## 2015-11-23 DIAGNOSIS — F29 Unspecified psychosis not due to a substance or known physiological condition: Secondary | ICD-10-CM | POA: Diagnosis not present

## 2015-11-23 DIAGNOSIS — F39 Unspecified mood [affective] disorder: Secondary | ICD-10-CM

## 2015-11-23 DIAGNOSIS — M159 Polyosteoarthritis, unspecified: Secondary | ICD-10-CM | POA: Diagnosis not present

## 2015-12-31 ENCOUNTER — Emergency Department: Payer: Medicare Other

## 2015-12-31 ENCOUNTER — Encounter: Payer: Self-pay | Admitting: *Deleted

## 2015-12-31 ENCOUNTER — Observation Stay
Admission: EM | Admit: 2015-12-31 | Discharge: 2016-01-02 | Disposition: A | Discharge status: Home / Self Care | Payer: Medicare Other | Attending: Internal Medicine | Admitting: Internal Medicine

## 2015-12-31 DIAGNOSIS — Z7982 Long term (current) use of aspirin: Secondary | ICD-10-CM | POA: Diagnosis not present

## 2015-12-31 DIAGNOSIS — N189 Chronic kidney disease, unspecified: Secondary | ICD-10-CM | POA: Insufficient documentation

## 2015-12-31 DIAGNOSIS — Z85828 Personal history of other malignant neoplasm of skin: Secondary | ICD-10-CM | POA: Insufficient documentation

## 2015-12-31 DIAGNOSIS — E785 Hyperlipidemia, unspecified: Secondary | ICD-10-CM | POA: Insufficient documentation

## 2015-12-31 DIAGNOSIS — Z880 Allergy status to penicillin: Secondary | ICD-10-CM | POA: Insufficient documentation

## 2015-12-31 DIAGNOSIS — K219 Gastro-esophageal reflux disease without esophagitis: Secondary | ICD-10-CM | POA: Insufficient documentation

## 2015-12-31 DIAGNOSIS — I160 Hypertensive urgency: Secondary | ICD-10-CM | POA: Insufficient documentation

## 2015-12-31 DIAGNOSIS — A419 Sepsis, unspecified organism: Secondary | ICD-10-CM | POA: Diagnosis present

## 2015-12-31 DIAGNOSIS — F419 Anxiety disorder, unspecified: Secondary | ICD-10-CM | POA: Insufficient documentation

## 2015-12-31 DIAGNOSIS — I129 Hypertensive chronic kidney disease with stage 1 through stage 4 chronic kidney disease, or unspecified chronic kidney disease: Secondary | ICD-10-CM | POA: Insufficient documentation

## 2015-12-31 DIAGNOSIS — G9341 Metabolic encephalopathy: Secondary | ICD-10-CM | POA: Insufficient documentation

## 2015-12-31 DIAGNOSIS — N390 Urinary tract infection, site not specified: Principal | ICD-10-CM | POA: Insufficient documentation

## 2015-12-31 DIAGNOSIS — R4182 Altered mental status, unspecified: Secondary | ICD-10-CM

## 2015-12-31 DIAGNOSIS — D696 Thrombocytopenia, unspecified: Secondary | ICD-10-CM | POA: Insufficient documentation

## 2015-12-31 DIAGNOSIS — Z79899 Other long term (current) drug therapy: Secondary | ICD-10-CM | POA: Diagnosis not present

## 2015-12-31 DIAGNOSIS — R112 Nausea with vomiting, unspecified: Secondary | ICD-10-CM | POA: Diagnosis present

## 2015-12-31 DIAGNOSIS — F0390 Unspecified dementia without behavioral disturbance: Secondary | ICD-10-CM | POA: Insufficient documentation

## 2015-12-31 HISTORY — DX: Essential (primary) hypertension: I10

## 2015-12-31 HISTORY — DX: Barrett's esophagus without dysplasia: K22.70

## 2015-12-31 HISTORY — DX: Anxiety disorder, unspecified: F41.9

## 2015-12-31 HISTORY — DX: Anemia, unspecified: D64.9

## 2015-12-31 HISTORY — DX: Gastro-esophageal reflux disease without esophagitis: K21.9

## 2015-12-31 HISTORY — DX: Disorder of kidney and ureter, unspecified: N28.9

## 2015-12-31 LAB — COMPREHENSIVE METABOLIC PANEL
ALT: 15 U/L (ref 14–54)
AST: 25 U/L (ref 15–41)
Albumin: 4.2 g/dL (ref 3.5–5.0)
Alkaline Phosphatase: 98 U/L (ref 38–126)
Anion gap: 11 (ref 5–15)
BILIRUBIN TOTAL: 1 mg/dL (ref 0.3–1.2)
BUN: 32 mg/dL — AB (ref 6–20)
CHLORIDE: 109 mmol/L (ref 101–111)
CO2: 21 mmol/L — ABNORMAL LOW (ref 22–32)
CREATININE: 1.08 mg/dL — AB (ref 0.44–1.00)
Calcium: 9.4 mg/dL (ref 8.9–10.3)
GFR, EST AFRICAN AMERICAN: 46 mL/min — AB (ref >60)
GFR, EST NON AFRICAN AMERICAN: 40 mL/min — AB (ref >60)
Glucose, Bld: 184 mg/dL — ABNORMAL HIGH (ref 65–99)
Potassium: 4.4 mmol/L (ref 3.5–5.1)
Sodium: 141 mmol/L (ref 135–145)
TOTAL PROTEIN: 6.4 g/dL — AB (ref 6.5–8.1)

## 2015-12-31 LAB — CBC WITH DIFFERENTIAL/PLATELET
Basophils Absolute: 0.1 10*3/uL (ref 0–0.1)
Basophils Relative: 0 %
EOS PCT: 0 %
Eosinophils Absolute: 0 10*3/uL (ref 0–0.7)
HEMATOCRIT: 38.9 % (ref 35.0–47.0)
Hemoglobin: 13.1 g/dL (ref 12.0–16.0)
LYMPHS ABS: 1.1 10*3/uL (ref 1.0–3.6)
LYMPHS PCT: 8 %
MCH: 30.4 pg (ref 26.0–34.0)
MCHC: 33.6 g/dL (ref 32.0–36.0)
MCV: 90.5 fL (ref 80.0–100.0)
MONO ABS: 1 10*3/uL — AB (ref 0.2–0.9)
MONOS PCT: 7 %
NEUTROS ABS: 11.9 10*3/uL — AB (ref 1.4–6.5)
Neutrophils Relative %: 85 %
PLATELETS: 125 10*3/uL — AB (ref 150–440)
RBC: 4.3 MIL/uL (ref 3.80–5.20)
RDW: 14.9 % — AB (ref 11.5–14.5)
WBC: 14 10*3/uL — ABNORMAL HIGH (ref 3.6–11.0)

## 2015-12-31 LAB — URINALYSIS COMPLETE WITH MICROSCOPIC (ARMC ONLY)
BILIRUBIN URINE: NEGATIVE
Glucose, UA: NEGATIVE mg/dL
Nitrite: NEGATIVE
PH: 5 (ref 5.0–8.0)
Protein, ur: 30 mg/dL — AB
SQUAMOUS EPITHELIAL / LPF: NONE SEEN
Specific Gravity, Urine: 1.01 (ref 1.005–1.030)

## 2015-12-31 LAB — TROPONIN I: Troponin I: <0.03 ng/mL (ref <0.03)

## 2015-12-31 LAB — MRSA PCR SCREENING: MRSA by PCR: NEGATIVE

## 2015-12-31 LAB — MAGNESIUM: Magnesium: 1.9 mg/dL (ref 1.7–2.4)

## 2015-12-31 LAB — LIPASE, BLOOD: LIPASE: 26 U/L (ref 11–51)

## 2015-12-31 MED ORDER — HYDRALAZINE HCL 20 MG/ML IJ SOLN
2.0000 mg | Freq: Once | INTRAMUSCULAR | Status: AC
  Administered 2015-12-31: 2 mg via INTRAVENOUS
  Filled 2015-12-31: qty 1

## 2015-12-31 MED ORDER — ACETAMINOPHEN 650 MG RE SUPP: 650.0000 mg | Freq: Four times a day (QID) | RECTAL | Status: DC | PRN

## 2015-12-31 MED ORDER — ONDANSETRON HCL 4 MG PO TABS: 4.0000 mg | ORAL_TABLET | Freq: Four times a day (QID) | ORAL | Status: DC | PRN

## 2015-12-31 MED ORDER — ARTIFICIAL TEARS OP OINT
TOPICAL_OINTMENT | OPHTHALMIC | Status: DC | PRN
  Filled 2015-12-31: qty 3.5

## 2015-12-31 MED ORDER — ONDANSETRON HCL 4 MG/2ML IJ SOLN
INTRAMUSCULAR | Status: AC
  Administered 2015-12-31: 4 mg via INTRAVENOUS
  Filled 2015-12-31: qty 2

## 2015-12-31 MED ORDER — ADULT MULTIVITAMIN W/MINERALS CH
1.0000 | ORAL_TABLET | Freq: Every day | ORAL | Status: DC
  Administered 2016-01-02: 11:00:00 1 via ORAL
  Filled 2015-12-31 (×3): qty 1

## 2015-12-31 MED ORDER — CIPROFLOXACIN IN D5W 200 MG/100ML IV SOLN
200.0000 mg | INTRAVENOUS | Status: DC
  Administered 2016-01-01 – 2016-01-02 (×2): 200 mg via INTRAVENOUS
  Filled 2015-12-31 (×2): qty 100

## 2015-12-31 MED ORDER — VITAMIN C 500 MG PO TABS
1000.0000 mg | ORAL_TABLET | ORAL | Status: DC
  Filled 2015-12-31: qty 2

## 2015-12-31 MED ORDER — CIPROFLOXACIN IN D5W 400 MG/200ML IV SOLN
400.0000 mg | Freq: Once | INTRAVENOUS | Status: AC
  Administered 2015-12-31: 400 mg via INTRAVENOUS
  Filled 2015-12-31: qty 200

## 2015-12-31 MED ORDER — ACETAMINOPHEN 325 MG PO TABS: 650.0000 mg | ORAL_TABLET | Freq: Four times a day (QID) | ORAL | Status: DC | PRN

## 2015-12-31 MED ORDER — VITAMIN D 1000 UNITS PO TABS
1000.0000 [IU] | ORAL_TABLET | Freq: Every day | ORAL | Status: DC
  Administered 2016-01-02: 1000 [IU] via ORAL
  Filled 2015-12-31 (×3): qty 1

## 2015-12-31 MED ORDER — BISACODYL 5 MG PO TBEC: 5.0000 mg | DELAYED_RELEASE_TABLET | Freq: Every day | ORAL | Status: DC | PRN

## 2015-12-31 MED ORDER — ACETAMINOPHEN 325 MG PO TABS
650.0000 mg | ORAL_TABLET | Freq: Three times a day (TID) | ORAL | Status: DC
  Administered 2016-01-01 – 2016-01-02 (×4): 650 mg via ORAL
  Filled 2015-12-31 (×5): qty 2

## 2015-12-31 MED ORDER — POLYETHYLENE GLYCOL 3350 17 G PO PACK
17.0000 g | PACK | Freq: Every day | ORAL | Status: DC
  Administered 2016-01-02: 11:00:00 17 g via ORAL
  Filled 2015-12-31: qty 1

## 2015-12-31 MED ORDER — LATANOPROST 0.005 % OP SOLN
1.0000 [drp] | Freq: Every day | OPHTHALMIC | Status: DC
  Administered 2015-12-31 – 2016-01-01 (×2): 1 [drp] via OPHTHALMIC
  Filled 2015-12-31: qty 2.5

## 2015-12-31 MED ORDER — ASPIRIN EC 81 MG PO TBEC
81.0000 mg | DELAYED_RELEASE_TABLET | Freq: Every day | ORAL | Status: DC
  Administered 2016-01-02: 11:00:00 81 mg via ORAL
  Filled 2015-12-31 (×2): qty 1

## 2015-12-31 MED ORDER — GUAIFENESIN 100 MG/5ML PO SYRP
200.0000 mg | ORAL_SOLUTION | ORAL | Status: DC | PRN
  Filled 2015-12-31: qty 10

## 2015-12-31 MED ORDER — PANTOPRAZOLE SODIUM 40 MG PO TBEC
40.0000 mg | DELAYED_RELEASE_TABLET | Freq: Two times a day (BID) | ORAL | Status: DC
  Administered 2016-01-01 – 2016-01-02 (×2): 40 mg via ORAL
  Filled 2015-12-31 (×3): qty 1

## 2015-12-31 MED ORDER — QUETIAPINE FUMARATE 25 MG PO TABS
25.0000 mg | ORAL_TABLET | Freq: Three times a day (TID) | ORAL | Status: DC
  Administered 2016-01-01 – 2016-01-02 (×4): 25 mg via ORAL
  Filled 2015-12-31 (×5): qty 1

## 2015-12-31 MED ORDER — ENOXAPARIN SODIUM 30 MG/0.3ML ~~LOC~~ SOLN
30.0000 mg | SUBCUTANEOUS | Status: DC
  Administered 2016-01-01: 21:00:00 30 mg via SUBCUTANEOUS
  Filled 2015-12-31: qty 0.3

## 2015-12-31 MED ORDER — TIMOLOL MALEATE 0.5 % OP SOLN
1.0000 [drp] | Freq: Every day | OPHTHALMIC | Status: DC
  Administered 2016-01-01 – 2016-01-02 (×3): 1 [drp] via OPHTHALMIC
  Filled 2015-12-31: qty 5

## 2015-12-31 MED ORDER — CITALOPRAM HYDROBROMIDE 20 MG PO TABS
20.0000 mg | ORAL_TABLET | ORAL | Status: DC
Start: 2016-01-01 — End: 2016-01-02
  Administered 2016-01-01: 20 mg via ORAL
  Filled 2015-12-31 (×2): qty 1

## 2015-12-31 MED ORDER — SODIUM CHLORIDE 0.9 % IV SOLN
INTRAVENOUS | Status: DC
  Administered 2015-12-31 – 2016-01-01 (×3): via INTRAVENOUS

## 2015-12-31 MED ORDER — CITALOPRAM HYDROBROMIDE 20 MG PO TABS
10.0000 mg | ORAL_TABLET | ORAL | Status: DC
  Administered 2016-01-02: 11:00:00 10 mg via ORAL

## 2015-12-31 MED ORDER — HYDROCODONE-ACETAMINOPHEN 5-325 MG PO TABS: 1.0000 | ORAL_TABLET | ORAL | Status: DC | PRN

## 2015-12-31 MED ORDER — MAGNESIUM CITRATE PO SOLN
1.0000 | Freq: Once | ORAL | Status: DC | PRN
  Filled 2015-12-31: qty 296

## 2015-12-31 MED ORDER — ONDANSETRON HCL 4 MG/2ML IJ SOLN
4.0000 mg | Freq: Four times a day (QID) | INTRAMUSCULAR | Status: DC | PRN
  Administered 2016-01-01: 10:00:00 4 mg via INTRAVENOUS
  Filled 2015-12-31: qty 2

## 2015-12-31 MED ORDER — SENNOSIDES-DOCUSATE SODIUM 8.6-50 MG PO TABS: 1.0000 | ORAL_TABLET | Freq: Every evening | ORAL | Status: DC | PRN

## 2015-12-31 MED ORDER — LORATADINE 10 MG PO TABS
10.0000 mg | ORAL_TABLET | Freq: Every day | ORAL | Status: DC
  Administered 2016-01-02: 11:00:00 10 mg via ORAL
  Filled 2015-12-31 (×2): qty 1

## 2015-12-31 MED ORDER — OCUVITE-LUTEIN PO CAPS
1.0000 | ORAL_CAPSULE | Freq: Two times a day (BID) | ORAL | Status: DC
  Administered 2016-01-01 – 2016-01-02 (×2): 1 via ORAL
  Filled 2015-12-31 (×3): qty 1

## 2015-12-31 MED ORDER — ONDANSETRON HCL 4 MG/2ML IJ SOLN
4.0000 mg | Freq: Once | INTRAMUSCULAR | Status: AC
  Administered 2015-12-31: 4 mg via INTRAVENOUS

## 2015-12-31 MED ORDER — CIPROFLOXACIN HCL 500 MG PO TABS: 500.0000 mg | ORAL_TABLET | Freq: Two times a day (BID) | ORAL | 0 refills | Status: DC

## 2015-12-31 NOTE — Discharge Instructions (Signed)
Please seek medical attention for any high fevers, chest pain, shortness of breath, change in behavior, persistent vomiting, bloody stool or any other new or concerning symptoms.  

## 2015-12-31 NOTE — ED Triage Notes (Signed)
Pt arrives via EMS from [facility], staff states pt has AMS this AM and a rash this AM, family states they want her evaluated, pt states she wants to be left alone to die, pt has DNR in place

## 2015-12-31 NOTE — ED Notes (Signed)
Pt's daughter at bedside at this time. This RN explained the delay in coming in to room and apologized for delay. Pt's daughter states understanding at this time. This RN repositioned pt, checked brief to make sure brief was dry, and explained plan of care to patient's family. Pt's daughter states that pt is normally not so confused and does not have the rash on her hands. Pt's daughter further explains the lesions to patient's face and top of head as being possible squamous cell skin cancer, and that patient has a hx of it. Pt states "thank you that feels better" after being repositioned. Explained to patient's daughter that the delay was waiting for admitting MD to come and assess the patient.

## 2015-12-31 NOTE — H&P (Signed)
[facility] - [place] [facility] Admission History and Physical Tonye Royalty, D.O.  ---------------------------------------------------------------------------------------------------------------------   PATIENT NAMERoza Glass MR#: [id number] DATE OF BIRTH: 12/06/1910 DATE OF ADMISSION: 12/31/2015 PRIMARY CARE PHYSICIAN: Tillman Abide, MD  REQUESTING/REFERRING PHYSICIAN: ED Dr. Lenard Lance  CHIEF COMPLAINT: Chief Complaint  Patient presents with  . Altered Mental Status    HISTORY OF PRESENT ILLNESS: Vanessa Glass is a 82 y.o. female with a known history of Hypertension, chronic kidney disease, GERD, anemia and anxiety was last seen by her family in a usual state of health 2 days ago. Her daughter reports that she was transferred to this facility secondary to confusion, new rash on her upper extremities as well as multiple episodes of vomiting. Since daughter states that she was unaware of any changes in medication, recent addition of new medication or change in status. At baseline she is awake alert and talkative, intermittently mildly confused, not ambulatory.  Per family the patient is supposed to be taking medications for hypertension, chronic kidney disease and hyperlipidemia.  Otherwise there has been no change in status. Patient has been taking medication as prescribed and there has been no recent change in medication or diet.  There has been no recent illness, travel.  EMS/ED COURSE:   Patient received ciprofloxacin.  PAST MEDICAL HISTORY: Past Medical History:  Diagnosis Date  . Anemia   . Anxiety   . Barrett's esophagus   . GERD (gastroesophageal reflux disease)   . Hypertension   . Renal disorder       PAST SURGICAL HISTORY: History reviewed. No pertinent surgical history.    SOCIAL HISTORY: Social History  Substance Use Topics  . Smoking status: Unknown If Ever Smoked  . Smokeless tobacco: Not on file  . Alcohol use Not on file       FAMILY HISTORY: History reviewed. No pertinent family history.   MEDICATIONS AT HOME: Prior to Admission medications   Medication Sig Start Date End Date Taking? Authorizing Provider  acetaminophen (MAPAP ARTHRITIS PAIN) 650 MG CR tablet Take 650 mg by mouth 3 (three) times daily.   Yes Historical Provider, MD  ALPRAZolam Prudy Feeler) 1 MG tablet Take 0.5 mg by mouth See admin instructions. Take 1/2 tablet (0.5 mg) by mouth four times a day-Routine Take 1/2 tablet (0.5 mg) by mouth 2 times a day as needed for anxiety- prn med   Yes Historical Provider, MD  Ascorbic Acid (VITAMIN C) 1000 MG tablet Take 1,000 mg by mouth every Monday, Wednesday, and Friday.   Yes Historical Provider, MD  aspirin EC 81 MG tablet Take 81 mg by mouth daily.   Yes Historical Provider, MD  carbamide peroxide (DEBROX) 6.5 % otic solution Place 5 drops into both ears 2 (two) times daily as needed. Until wax resolved. *Max 4 day duration* Then flush with water.   Yes Historical Provider, MD  cholecalciferol (VITAMIN D) 1000 units tablet Take 1,000 Units by mouth daily.   Yes Historical Provider, MD  citalopram (CELEXA) 10 MG tablet Take 10 mg by mouth every other day. *Alternating with 20 mg dose*   Yes Historical Provider, MD  citalopram (CELEXA) 20 MG tablet Take 20 mg by mouth every other day. *Alternating with 10 mg dose*   Yes Historical Provider, MD  fexofenadine (ALLEGRA) 180 MG tablet Take 180 mg by mouth daily as needed for allergies or rhinitis.   Yes Historical Provider, MD  guaifenesin (TUSSIN) 100 MG/5ML syrup Take 200 mg by mouth every 4 (four) hours  as needed for cough or congestion.   Yes Historical Provider, MD  Hypromellose (ARTIFICIAL TEARS OP) Apply 2 drops to eye every 2 (two) hours as needed. For dry eyes.   Yes Historical Provider, MD  latanoprost (XALATAN) 0.005 % ophthalmic solution Place 1 drop into both eyes every evening.   Yes Historical Provider, MD  Multiple Vitamins-Minerals (PRESERVISION  AREDS) CAPS Take 1 capsule by mouth 2 (two) times daily.   Yes Historical Provider, MD  Multiple Vitamins-Minerals (SENTRY SENIOR) TABS Take 1 tablet by mouth daily.   Yes Historical Provider, MD  pantoprazole (PROTONIX) 40 MG tablet Take 40 mg by mouth 2 (two) times daily.   Yes Historical Provider, MD  POLYETHYLENE GLYCOL 3350 PO Take 17 g by mouth daily. Mix in 8 oz. of water.   Yes Historical Provider, MD  QUEtiapine (SEROQUEL) 25 MG tablet Take 25 mg by mouth 3 (three) times daily.   Yes Historical Provider, MD  timolol (TIMOPTIC) 0.5 % ophthalmic solution Place 1 drop into both eyes every morning.   Yes Historical Provider, MD  ciprofloxacin (CIPRO) 500 MG tablet Take 1 tablet (500 mg total) by mouth 2 (two) times daily. 12/31/15 01/10/16  Phineas SemenGraydon Goodman, MD    Per family patient is supposed to be taking medications for hypertension and hyperlipidemia.  DRUG ALLERGIES: Allergies  Allergen Reactions  . Erythromycin Other (See Comments)    Unknown reaction  . Furadantin [Nitrofurantoin] Other (See Comments)    Unknown reaction  . Keflex [Cephalexin] Other (See Comments)    Unknown reaction  . Macrolides And Ketolides Other (See Comments)    Unknown reaction  . Penicillins Rash    Has patient had a PCN reaction causing immediate rash, facial/tongue/throat swelling, SOB or lightheadedness with hypotension: Yes Has patient had a PCN reaction causing severe rash involving mucus membranes or skin necrosis: No Has patient had a PCN reaction that required hospitalization: unknown Has patient had a PCN reaction occurring within the last 10 years: unknown If all of the above answers are "NO", then may proceed with Cephalosporin use.  . Sulfa Antibiotics Rash     REVIEW OF SYSTEMS: Unable to obtain secondary to patient's altered mental status.    PHYSICAL EXAMINATION: VITAL SIGNS: Blood pressure (!) 181/106, pulse 90, temperature 97.6 F (36.4 C), temperature source Axillary, resp. rate  (!) 22, height 5\' 2"  (1.575 m), weight 59.7 kg (131 lb 9.6 oz), SpO2 97 %.  GENERAL: [age] y.o.-year-old white female patient, well-developed, well-nourished lying in the bed in no acute distress.  Confused, mumbling, hard of hearing.  HEENT: Head atraumatic, normocephalic. Pupils equal, round, reactive to light and accommodation. No scleral icterus. Extraocular muscles intact. Nares are patent. Oropharynx is clear. Mucus membranes dry. NECK: Supple, full range of motion. No JVD, no bruit heard. No thyroid enlargement, no tenderness, no cervical lymphadenopathy. CHEST: Normal breath sounds bilaterally. No wheezing, rales, rhonchi or crackles. No use of accessory muscles of respiration.  No reproducible chest wall tenderness.  CARDIOVASCULAR: S1, S2 normal. No murmurs, rubs, or gallops appreciated. Cap refill <2 seconds. ABDOMEN: Soft, nontender, nondistended. No rebound, guarding, rigidity. Normoactive bowel sounds present in all four quadrants. No organomegaly or mass. EXTREMITIES: Full range of motion. No pedal edema, cyanosis, or clubbing. NEUROLOGIC: Cranial nerves II through XII are grossly intact with no focal sensorimotor deficit. Muscle strength 5/5 in all extremities. Sensation intact. Gait not checked. SKIN: Bright erythema bilateral forearms and hands. There is severe dark raised skin lesions on her cheek,  bilateral hands and diffusely on her scalp. Her daughter these are chronic and have not yet been evaluated by dermatology.  LABORATORY PANEL:  CBC  Recent Labs Lab 12/31/15 1146  WBC 14.0*  HGB 13.1  HCT 38.9  PLT 125*   ----------------------------------------------------------------------------------------------------------------- Chemistries  Recent Labs Lab 12/31/15 1146  NA 141  K 4.4  CL 109  CO2 21*  GLUCOSE 184*  BUN 32*  CREATININE 1.08*  CALCIUM 9.4  AST 25  ALT 15  ALKPHOS 98  BILITOT 1.0    ------------------------------------------------------------------------------------------------------------------ Cardiac Enzymes  Recent Labs Lab 12/31/15 1146  TROPONINI <0.03   ------------------------------------------------------------------------------------------------------------------  RADIOLOGY: Ct Head Wo Contrast  Result Date: 12/31/2015 CLINICAL DATA:  Acute mental status change and rash. History of skin cancer. EXAM: CT HEAD WITHOUT CONTRAST TECHNIQUE: Contiguous axial images were obtained from the base of the skull through the vertex without intravenous contrast. COMPARISON:  November 07, 2013 FINDINGS: Brain: No subdural, epidural, or subarachnoid hemorrhage. Cerebellum, brainstem, and basal cisterns are normal. Scattered white matter changes are again identified. No acute cortical ischemia or infarct. Ventricles and sulci are prominent but stable. Vascular: Calcifications are seen in the intracranial portions of the carotid artery. Skull: Normal. Negative for fracture or focal lesion. Sinuses/Orbits: No acute finding. Other: Skin thickening and irregularity of the scout anteriorly and superiorly. Recommend clinical correlation. IMPRESSION: 1. No acute intracranial process. 2. Skin thickening near the vertex of the scalp, particularly on the left. Recommend clinical correlation. Electronically Signed   By: Gerome Sam III M.D   On: 12/31/2015 11:29    IMPRESSION AND PLAN:  This is a 82 y.o. female with a history of Hypertension, chronic kidney disease, hyperlipidemia, GERD, anemia and anxiety now being admitted with: 1. Sepsis secondary to urinary tract infection. We will admit for IV fluids, IV antibiotics, Cipro was chosen due to multiple drug allergies. We will follow up urine cultures and observe for improvement in mental status. 2. Hypertensive urgency-will treat with low-dose hydralazine and observe for improvement. We will monitor the patient on telemetry. Patient  reportedly has a history of hypertension but is not currently on any antihypertensives at home. We will attempt to obtain her accurate medication list. 3. Rash bilateral forearms-we'll monitor for resolution and consider outpatient evaluation if no improvement. 4. GERD-continue Protonix 5. Seasonal allergies-continue Allegra and Claritin 6. Anxiety-continue Celexa and Seroquel. We will hold Xanax at this time due to lethargy and confusion. However may consider when necessary dosing if needed for agitation or anxiety. 7. We'll continue eyedrops and multivitamins.   Diet/Nutrition: Heart healthy Fluids: IV normal saline DVT Px: Lovenox, SCDs and early ambulation Code Status: DO NOT RESUSCITATE. This is confirmed with the patient's daughter who is also her healthcare proxy and a copy of the DO NOT RESUSCITATE is available on the chart.  All the records are reviewed and case discussed with ED provider. Management plans discussed with the patient's family who express understanding and agree with plan of care.   TOTAL TIME TAKING CARE OF THIS PATIENT: 60 minutes.   Tobechukwu Emmick D.O. on 12/31/2015 at 6:55 PM Between 7am to 6pm - Pager - [phone] After 6pm go to [email or web address] - Social research officer, government [facility]  Hospitalists Office [phone] CC: Primary care physician; Tillman Abide, MD     Note: This dictation was prepared with Dragon dictation along with smaller phrase technology. Any transcriptional errors that result from this process are unintentional.

## 2015-12-31 NOTE — ED Notes (Signed)
DC instructions discussed with Judeth CornfieldStephanie at [facility]. Family notified as well.

## 2015-12-31 NOTE — Care Management Obs Status (Signed)
MEDICARE OBSERVATION STATUS NOTIFICATION   Patient Details  Name: Vanessa Glass C Fajardo MRN: [id number] Date of Birth: 1910/12/16   Medicare Observation Status Notification Given:  Yes    CrutchfieldDerrill Memo, Sandar Krinke M, RN 12/31/2015, 7:32 PM

## 2015-12-31 NOTE — ED Notes (Signed)
MD notified of patient's vomiting. Received verbal orders for 4mg  of Zofran. Pt repositioned at this time. Pt noted to be confused, calling out and yelling at her family and this RN.

## 2015-12-31 NOTE — Consult Note (Signed)
Pharmacy Antibiotic Note  Vanessa Glass is a [age] y.o. female admitted on 12/31/2015 with UTI.  Pharmacy has been consulted for cipro dosing.Pt has mult drug allergies, cannot take PNC or cephalosporins  Plan: ciprofloxacin 200mg  IV q 24hrs  Height: 5\' 2"  (157.5 cm) Weight: 131 lb 9.6 oz (59.7 kg) IBW/kg (Calculated) : 50.1  Temp (24hrs), Avg:97.9 F (36.6 C), Min:97.6 F (36.4 C), Max:98.1 F (36.7 C)   Recent Labs Lab 12/31/15 1146  WBC 14.0*  CREATININE 1.08*    Estimated Creatinine Clearance: 20.3 mL/min (by C-G formula based on SCr of 1.08 mg/dL).    Allergies  Allergen Reactions  . Erythromycin Other (See Comments)    Unknown reaction  . Furadantin [Nitrofurantoin] Other (See Comments)    Unknown reaction  . Keflex [Cephalexin] Other (See Comments)    Unknown reaction  . Macrolides And Ketolides Other (See Comments)    Unknown reaction  . Penicillins Rash    Has patient had a PCN reaction causing immediate rash, facial/tongue/throat swelling, SOB or lightheadedness with hypotension: Yes Has patient had a PCN reaction causing severe rash involving mucus membranes or skin necrosis: No Has patient had a PCN reaction that required hospitalization: unknown Has patient had a PCN reaction occurring within the last 10 years: unknown If all of the above answers are "NO", then may proceed with Cephalosporin use.  . Sulfa Antibiotics Rash    Antimicrobials this admission: cipro 9/10 >>    Dose adjustments this admission:   Microbiology results:  9/10 UCx:     Thank you for allowing pharmacy to be a part of this patient's care.  Vanessa FlossMelissa D Wava Glass 12/31/2015 7:54 PM

## 2015-12-31 NOTE — ED Provider Notes (Signed)
-----------------------------------------   4:58 PM on 12/31/2015 -----------------------------------------  Patient care assumed from Dr. Derrill KayGoodman. Plan was for patient to be discharged once EMS arrived. Family is here with the patient, they state the patient continues to be confused, has a rash over her upper extremities consistent with erythema, petechial rash over her hands, they state this rash is new. Family states they are not comfortable with the patient going back to the nursing facility with much prefer an admission if possible. Per report they state the patient has been very confused today, vomited multiple times at the nursing facility prior to arrival in the emergency department. As the patient continues to be confused per family with a significant urinary tract infection on urinalysis and a moderate leukocytosis we will admit to the hospital for further treatment of IV antibiotics. The daughter states the patient does have a DO NOT RESUSCITATE order.    Minna AntisKevin Burnette Sautter, MD 12/31/15 1659

## 2015-12-31 NOTE — ED Notes (Signed)
Care taken over from [place], [place].

## 2015-12-31 NOTE — ED Notes (Signed)
Patient frequently coughing up clear bile colored liquid. Lungs are coarse. Pt has spontaneously opened her eyes.  Pt upper extremities are ruddy in color.

## 2015-12-31 NOTE — ED Notes (Signed)
Family at bedside. 

## 2015-12-31 NOTE — ED Notes (Signed)
This RN to bedside to assess patient needs at this time. Pt began vomiting green bilious fluid. Pt noted to have soiled the bed and the sheets. This RN attempting to change patient's sheets with Danelle EarthlyNoel, Charity fundraiserN. Pt noted to desat to 92%, placed on 2L via [place], pt's O2 sat 96% at this time.

## 2016-01-01 LAB — URINE CULTURE

## 2016-01-01 LAB — CBC
HEMATOCRIT: 38.9 % (ref 35.0–47.0)
Hemoglobin: 13 g/dL (ref 12.0–16.0)
MCH: 30.4 pg (ref 26.0–34.0)
MCHC: 33.4 g/dL (ref 32.0–36.0)
MCV: 91.2 fL (ref 80.0–100.0)
PLATELETS: 109 10*3/uL — AB (ref 150–440)
RBC: 4.26 MIL/uL (ref 3.80–5.20)
RDW: 14.7 % — AB (ref 11.5–14.5)
WBC: 16.2 10*3/uL — AB (ref 3.6–11.0)

## 2016-01-01 LAB — BASIC METABOLIC PANEL
Anion gap: 11 (ref 5–15)
BUN: 30 mg/dL — AB (ref 6–20)
CALCIUM: 9.2 mg/dL (ref 8.9–10.3)
CO2: 23 mmol/L (ref 22–32)
Chloride: 109 mmol/L (ref 101–111)
Creatinine, Ser: 1.11 mg/dL — ABNORMAL HIGH (ref 0.44–1.00)
GFR calc Af Amer: 45 mL/min — ABNORMAL LOW (ref >60)
GFR, EST NON AFRICAN AMERICAN: 39 mL/min — AB (ref >60)
GLUCOSE: 162 mg/dL — AB (ref 65–99)
POTASSIUM: 4.5 mmol/L (ref 3.5–5.1)
SODIUM: 143 mmol/L (ref 135–145)

## 2016-01-01 MED ORDER — ALPRAZOLAM 0.5 MG PO TABS
0.5000 mg | ORAL_TABLET | ORAL | Status: DC | PRN
Start: 2016-01-01 — End: 2016-01-02
  Administered 2016-01-01: 12:00:00 0.5 mg via ORAL
  Filled 2016-01-01: qty 1

## 2016-01-01 NOTE — Progress Notes (Signed)
Palliative Medicine consult noted. Due to high referral volume, there may be a delay seeing this patient. Please call the Palliative Medicine Team office at [phone] if recommendations are needed in the interim.  Thank you for inviting us to see this patient.  Margret ChanceMelanie G. Wayland Baik, RN, BSN, [facility] 01/01/2016 2:29 PM Cell [phone] 8:00-4:00 Monday-Friday Office [phone]

## 2016-01-01 NOTE — [place] FL2 (Signed)
[place] MEDICAID FL2 LEVEL OF CARE SCREENING TOOL     IDENTIFICATION  Patient Name: Vanessa Glass Birthdate: [birth year] Sex: female Admission Date (Current Location): 12/31/2015  [place] Number:  Chiropodist and Address:  [facility], [address] [place], [place] [address]      Provider Number: [id number]  Attending Physician Name and Address:  Wyatt Haste, MD  Relative Name and Phone Number:       Current Level of Care: Hospital Recommended Level of Care: Skilled Nursing Facility Prior Approval Number:    Date Approved/Denied:   PASRR Number:  [id number])  Discharge Plan: SNF    Current Diagnoses: Patient Active Problem List   Diagnosis Date Noted  . Sepsis secondary to UTI (HCC) 12/31/2015    Orientation RESPIRATION BLADDER Height & Weight      (Disoriented)  Normal Incontinent Weight: 126 lb 12.8 oz (57.5 kg) Height:  5\' 1"  (154.9 cm)  BEHAVIORAL SYMPTOMS/MOOD NEUROLOGICAL BOWEL NUTRITION STATUS   (None)  (None) Incontinent Diet ( DYS 3)  AMBULATORY STATUS COMMUNICATION OF NEEDS Skin   Extensive Assist Verbally Other (Comment) (Right Black Scabbed Area; Head Anterior Scabbed; Peripheral IV- Right Arm)                       Personal Care Assistance Level of Assistance  Feeding, Bathing, Dressing Bathing Assistance: Limited assistance Feeding assistance: Limited assistance Dressing Assistance: Limited assistance     Functional Limitations Info  Sight, Hearing, Speech Sight Info: Adequate Hearing Info: Adequate Speech Info: Adequate    SPECIAL CARE FACTORS FREQUENCY                       Contractures      Additional Factors Info  Code Status, Allergies Code Status Info:  (DNR) Allergies Info:  (Erythromycin, Furadantin Nitrofurantoin, Keflex Cephalexin, Macrolides And Ketolides, Penicillins, Sulfa Antibiotics)           Current Medications (01/01/2016):  This is the current  hospital active medication list Current Facility-Administered Medications  Medication Dose Route Frequency Provider Last Rate Last Dose  . 0.9 %  sodium chloride infusion   Intravenous Continuous Alexis Hugelmeyer, DO 75 mL/hr at 01/01/16 1206    . acetaminophen (TYLENOL) tablet 650 mg  650 mg Oral Q6H PRN Alexis Hugelmeyer, DO       Or  . acetaminophen (TYLENOL) suppository 650 mg  650 mg Rectal Q6H PRN Alexis Hugelmeyer, DO      . acetaminophen (TYLENOL) tablet 650 mg  650 mg Oral TID Alexis Hugelmeyer, DO   650 mg at 01/01/16 1134  . ALPRAZolam Prudy Feeler) tablet 0.5 mg  0.5 mg Oral Q4H PRN Wyatt Haste, MD   0.5 mg at 01/01/16 1135  . artificial tears (LACRILUBE) ophthalmic ointment   Both Eyes Q2H PRN Alexis Hugelmeyer, DO      . aspirin EC tablet 81 mg  81 mg Oral Daily Alexis Hugelmeyer, DO      . bisacodyl (DULCOLAX) EC tablet 5 mg  5 mg Oral Daily PRN Alexis Hugelmeyer, DO      . cholecalciferol (VITAMIN D) tablet 1,000 Units  1,000 Units Oral Daily Alexis Hugelmeyer, DO      . ciprofloxacin (CIPRO) IVPB 200 mg  200 mg Intravenous Q24H Melissa D Maccia, RPH   200 mg at 01/01/16 0945  . citalopram (CELEXA) tablet 10 mg  10 mg Oral QODAY Alexis Hugelmeyer, DO      .  citalopram (CELEXA) tablet 20 mg  20 mg Oral QODAY Alexis Hugelmeyer, DO   20 mg at 01/01/16 1135  . enoxaparin (LOVENOX) injection 30 mg  30 mg Subcutaneous Q24H Alexis Hugelmeyer, DO      . guaifenesin (ROBITUSSIN) 100 MG/5ML syrup 200 mg  200 mg Oral Q4H PRN Alexis Hugelmeyer, DO      . HYDROcodone-acetaminophen (NORCO/VICODIN) 5-325 MG per tablet 1 tablet  1 tablet Oral Q4H PRN Alexis Hugelmeyer, DO      . latanoprost (XALATAN) 0.005 % ophthalmic solution 1 drop  1 drop Both Eyes QHS Alexis Hugelmeyer, DO   1 drop at 12/31/15 2200  . loratadine (CLARITIN) tablet 10 mg  10 mg Oral Daily Alexis Hugelmeyer, DO      . magnesium citrate solution 1 Bottle  1 Bottle Oral Once PRN Alexis Hugelmeyer, DO      . multivitamin with  minerals tablet 1 tablet  1 tablet Oral Daily Alexis Hugelmeyer, DO      . multivitamin-lutein (OCUVITE-LUTEIN) capsule 1 capsule  1 capsule Oral BID Alexis Hugelmeyer, DO      . ondansetron (ZOFRAN) tablet 4 mg  4 mg Oral Q6H PRN Alexis Hugelmeyer, DO       Or  . ondansetron (ZOFRAN) injection 4 mg  4 mg Intravenous Q6H PRN Alexis Hugelmeyer, DO   4 mg at 01/01/16 0936  . pantoprazole (PROTONIX) EC tablet 40 mg  40 mg Oral BID Alexis Hugelmeyer, DO      . polyethylene glycol (MIRALAX / GLYCOLAX) packet 17 g  17 g Oral Daily Alexis Hugelmeyer, DO      . QUEtiapine (SEROQUEL) tablet 25 mg  25 mg Oral TID Alexis Hugelmeyer, DO   25 mg at 01/01/16 1135  . senna-docusate (Senokot-S) tablet 1 tablet  1 tablet Oral QHS PRN Alexis Hugelmeyer, DO      . timolol (TIMOPTIC) 0.5 % ophthalmic solution 1 drop  1 drop Both Eyes Daily Alexis Hugelmeyer, DO   1 drop at 01/01/16 1136  . vitamin C (ASCORBIC ACID) tablet 1,000 mg  1,000 mg Oral Q M,W,F Alexis Hugelmeyer, DO         Discharge Medications: Please see discharge summary for a list of discharge medications.  Relevant Imaging Results:  Relevant Lab Results:   Additional Information  (SSN [id number])  Verta Ellenhristina E Adaisha Campise, LCSW

## 2016-01-01 NOTE — Progress Notes (Signed)
Initial Nutrition Assessment  DOCUMENTATION CODES:      INTERVENTION:  -Recommend downgrading diet to Dysphagia III -Recommend addition of Magic Cup BID -If po intake inadequate on follow-up, recommend addition of Ensure   NUTRITION DIAGNOSIS:   Inadequate oral intake related to acute illness as evidenced by per patient/family report.  GOAL:   Patient will meet greater than or equal to 90% of their needs  MONITOR:   PO intake, Supplement acceptance, Labs, Weight trends  REASON FOR ASSESSMENT:   Low Braden    ASSESSMENT:    [age] yo female admitted with sepsis due to UTI, hypertensive urgency, metabolic encephalopathy  Pt resting on visit today; family at bedside. Family reports pt has not eaten anything today, took sips of water, coke. +N/V today. Family does report that pt needs soft foods, ground/chopped meats (indicate that pt would not have been able to eat the meat sent today on meal tray). Prior to admission, family reports pt eating fairly well, not taking any nutritional supplement such as Ensure/Boost. Pt with gradual wt loss over the past 5 years; pt weighed 165 pounds 5 years ago. 23.6% wt loss in 5 years.  Nutrition-Focused physical exam completed. Findings are no fat depletion, mild muscle depletion, and no edema.   Past Medical History:  Diagnosis Date  . Anemia   . Anxiety   . Barrett's esophagus   . GERD (gastroesophageal reflux disease)   . Hypertension   . Renal disorder     Diet Order: Heart Healthy  Skin:  Reviewed, no issues  Last BM:  9/10   Labs: reviewed  Meds: MVI, NS at 75 ml/hr  Height:   Ht Readings from Last 1 Encounters:  12/31/15 5\' 1"  (1.549 m)    Weight:   Wt Readings from Last 1 Encounters:  12/31/15 126 lb 12.8 oz (57.5 kg)    Filed Weights   12/31/15 1019 12/31/15 2109  Weight: 131 lb 9.6 oz (59.7 kg) 126 lb 12.8 oz (57.5 kg)    BMI:  Body mass index is 23.96 kg/m.  Estimated Nutritional Needs:   Kcal:   1200-1500 kcals  Protein:  60-75 g  Fluid:  >/= 1.5 L  EDUCATION NEEDS:    No education needs identified at this time    Romelle StarcherCate Graysyn Bache MS, RD, LDN [phone] Pager  [phone] Weekend/On-Call Pager

## 2016-01-01 NOTE — Progress Notes (Signed)
[facility] [place] at [facility]   PATIENT NAME: Vanessa Glass    MRN#:  [id number]  DATE OF BIRTH:  Nov 12, 1910  SUBJECTIVE:  Hospital Day: 0 days Vanessa Glass is a 82 y.o. female presenting with Altered Mental Status .   Overnight events: No acute overnight events Interval Events: Patient unable to provide much information given mental status patient's daughter present at bedside  REVIEW OF SYSTEMS:  Unable to provide information  DRUG ALLERGIES:   Allergies  Allergen Reactions  . Erythromycin Other (See Comments)    Unknown reaction  . Furadantin [Nitrofurantoin] Other (See Comments)    Unknown reaction  . Keflex [Cephalexin] Other (See Comments)    Unknown reaction  . Macrolides And Ketolides Other (See Comments)    Unknown reaction  . Penicillins Rash    Has patient had a PCN reaction causing immediate rash, facial/tongue/throat swelling, SOB or lightheadedness with hypotension: Yes Has patient had a PCN reaction causing severe rash involving mucus membranes or skin necrosis: No Has patient had a PCN reaction that required hospitalization: unknown Has patient had a PCN reaction occurring within the last 10 years: unknown If all of the above answers are "NO", then may proceed with Cephalosporin use.  . Sulfa Antibiotics Rash    VITALS:  Blood pressure (!) 152/58, pulse 79, temperature 98.1 F (36.7 C), temperature source Oral, resp. rate 17, height 5\' 1"  (1.549 m), weight 57.5 kg (126 lb 12.8 oz), SpO2 100 %.  PHYSICAL EXAMINATION:   VITAL SIGNS: Vitals:   01/01/16 1208 01/01/16 1212  BP:  (!) 152/58  Pulse: 79 79  Resp: 17   Temp: 98.1 F (36.7 C) 98.1 F (36.7 C)   GENERAL:82 y.o.female moderate distress given mental status.  HEAD: Normocephalic, atraumatic.  EYES: Pupils equal, round, reactive to light. Unable to assess extraocular muscles given mental status/medical condition. No scleral icterus.  MOUTH: Moist mucosal  membrane. Dentition intact. No abscess noted.  EAR, NOSE, THROAT: Clear without exudates. No external lesions.  NECK: Supple. No thyromegaly. No nodules. No JVD.  PULMONARY: Clear to ascultation, without wheeze rails or rhonci. No use of accessory muscles, Good respiratory effort. good air entry bilaterally CHEST: Nontender to palpation.  CARDIOVASCULAR: S1 and S2. Regular rate and rhythm. No murmurs, rubs, or gallops. No edema. Pedal pulses 2+ bilaterally.  GASTROINTESTINAL: Soft, nontender, nondistended. No masses. Positive bowel sounds. No hepatosplenomegaly.  MUSCULOSKELETAL: No swelling, clubbing, or edema. Range of motion full in all extremities.  NEUROLOGIC: Unable to assess given mental status/medical condition SKIN: Upper extremity erythema forearms and hands otherwise No ulceration,, rashes, or cyanosis. Skin warm and dry. Turgor intact.  PSYCHIATRIC: Unable to assess given mental status/medical condition       LABORATORY PANEL:   CBC  Recent Labs Lab 01/01/16 0557  WBC 16.2*  HGB 13.0  HCT 38.9  PLT 109*   ------------------------------------------------------------------------------------------------------------------  Chemistries   Recent Labs Lab 12/31/15 1146 01/01/16 0557  NA 141 143  K 4.4 4.5  CL 109 109  CO2 21* 23  GLUCOSE 184* 162*  BUN 32* 30*  CREATININE 1.08* 1.11*  CALCIUM 9.4 9.2  MG 1.9  --   AST 25  --   ALT 15  --   ALKPHOS 98  --   BILITOT 1.0  --    ------------------------------------------------------------------------------------------------------------------  Cardiac Enzymes  Recent Labs Lab 12/31/15 1146  TROPONINI <0.03   ------------------------------------------------------------------------------------------------------------------  RADIOLOGY:  Ct Head Wo Contrast  Result Date: 12/31/2015 CLINICAL  DATA:  Acute mental status change and rash. History of skin cancer. EXAM: CT HEAD WITHOUT CONTRAST TECHNIQUE:  Contiguous axial images were obtained from the base of the skull through the vertex without intravenous contrast. COMPARISON:  November 07, 2013 FINDINGS: Brain: No subdural, epidural, or subarachnoid hemorrhage. Cerebellum, brainstem, and basal cisterns are normal. Scattered white matter changes are again identified. No acute cortical ischemia or infarct. Ventricles and sulci are prominent but stable. Vascular: Calcifications are seen in the intracranial portions of the carotid artery. Skull: Normal. Negative for fracture or focal lesion. Sinuses/Orbits: No acute finding. Other: Skin thickening and irregularity of the scout anteriorly and superiorly. Recommend clinical correlation. IMPRESSION: 1. No acute intracranial process. 2. Skin thickening near the vertex of the scalp, particularly on the left. Recommend clinical correlation. Electronically Signed   By: Gerome Samavid  Williams III M.D   On: 12/31/2015 11:29    EKG:   Orders placed or performed in visit on 11/07/13  . EKG 12-Lead    ASSESSMENT AND PLAN:   Vanessa Glass is a [age] y.o. female presenting with Altered Mental Status . Admitted 12/31/2015 : Day #: 0 days  1. Sepsis present on admission, secondary to urinary tract infection: Continue Cipro for culture data 2. Hypertensive urgency: Blood pressure improved 3. Metabolic encephalopathy: Combination of UTI plus dementia 4. GERD without esophagitis: PPI therapy 5. Disposition: Family request plate of care consult   All the records are reviewed and case discussed with Care Management/Social Workerr. Management plans discussed with the patient, family and they are in agreement.  CODE STATUS: dnr TOTAL TIME TAKING CARE OF THIS PATIENT: 28 minutes.   POSSIBLE D/C IN 2-3DAYS, DEPENDING ON CLINICAL CONDITION.   Enyah Moman,  Mardi MainlandDavid K M.D on 01/01/2016 at 12:51 PM  Between 7am to 6pm - Pager - [phone]  After 6pm: House Pager: - [phone]  [facility]  Office   [phone]  CC: Primary care physician; Tillman Abideichard Letvak, MD

## 2016-01-02 LAB — CBC
HEMATOCRIT: 31.6 % — AB (ref 35.0–47.0)
HEMOGLOBIN: 10.7 g/dL — AB (ref 12.0–16.0)
MCH: 30.7 pg (ref 26.0–34.0)
MCHC: 33.7 g/dL (ref 32.0–36.0)
MCV: 91 fL (ref 80.0–100.0)
PLATELETS: 89 10*3/uL — AB (ref 150–440)
RBC: 3.47 MIL/uL — AB (ref 3.80–5.20)
RDW: 14.8 % — ABNORMAL HIGH (ref 11.5–14.5)
WBC: 8.6 10*3/uL (ref 3.6–11.0)

## 2016-01-02 MED ORDER — CIPROFLOXACIN HCL 500 MG PO TABS: 500.0000 mg | ORAL_TABLET | Freq: Two times a day (BID) | ORAL | 0 refills | Status: AC

## 2016-01-02 NOTE — Progress Notes (Signed)
New referral for Palliative NP top follow at [facility] following discharge received from CSW [employer] CorporationChristina Sunkins. [facility] referral made aware. Thank you. Dayna BarkerKaren Robertson RN, BSN, [facility], [facility] L[facility]ison [phone] c

## 2016-01-02 NOTE — Progress Notes (Signed)
Clinical Social Worker was informed that patient will be medically ready to discharge to [facility]. Patient's daughter is in a agreement with plan. CSW called Sue Lushndrea- Admissions Coordinator at [facility] to confirm that patient's bed is ready. Provided patient's room number 257 and number to call for report [phone]. All discharge information faxed [facility] via [employer]. Rx's and DNR added to discharge packet.   RN will call report and patient will [facility].  Woodroe Modehristina Karsynn Deweese, MSW, LCSW, LCAS-A Clinical Social Worker [phone]

## 2016-01-02 NOTE — Progress Notes (Signed)
[facility]. Discharge packet in pt's slot. Report given to Melody, Charity fundraiserN. Awiating EMS.

## 2016-01-02 NOTE — Discharge Summary (Signed)
[facility] [place] at [facility]   PATIENT NAME: Vanessa Glass    MR#:  [id number]  DATE OF BIRTH:  [birth year]  DATE OF ADMISSION:  12/31/2015 ADMITTING PHYSICIAN: Tonye Royalty, DO  DATE OF DISCHARGE: 01/02/2016  PRIMARY CARE PHYSICIAN: Tillman Abide, MD    ADMISSION DIAGNOSIS:  UTI (lower urinary tract infection) [N39.0] Altered mental status, unspecified altered mental status type [R41.82] Non-intractable vomiting with nausea, vomiting of unspecified type [R11.2]  DISCHARGE DIAGNOSIS:  Active Problems:   Sepsis secondary to UTI (HCC)   SECONDARY DIAGNOSIS:   Past Medical History:  Diagnosis Date  . Anemia   . Anxiety   . Barrett's esophagus   . GERD (gastroesophageal reflux disease)   . Hypertension   . Renal disorder     HOSPITAL COURSE:   82 y.o. female presenting with Altered Mental Status and sepsis from UTI  1. Sepsis present on admission, secondary to urinary tract infection: She was started on Cipro. Urine culture shows mixed flora. Her WBC improved and she was afebrile. Se will continue Cipro for a totoal of 12 days.  2. Hypertensive urgency: Blood pressure improved She does not need BP meds at discharge due to age and low normal blood pressure at discharge without medications.  3. Metabolic encephalopathy: Due to  UTI and worsening of underlying dementia  Patient will need to be reoriented to time, date and place and please keep lights on during day and off at night.  4. GERD without esophagitis: PPI therapy  5. Low platelets: Stopped lovenox. Repeat CBC in one week Palliative care consult requested at facility.   DISCHARGE CONDITIONS AND DIET:  Stable  Regular diet  CONSULTS OBTAINED:  Treatment Team:  Adrian Saran, MD  DRUG ALLERGIES:   Allergies  Allergen Reactions  . Erythromycin Other (See Comments)    Unknown reaction  . Furadantin [Nitrofurantoin] Other (See Comments)    Unknown reaction  . Keflex  [Cephalexin] Other (See Comments)    Unknown reaction  . Macrolides And Ketolides Other (See Comments)    Unknown reaction  . Penicillins Rash    Has patient had a PCN reaction causing immediate rash, facial/tongue/throat swelling, SOB or lightheadedness with hypotension: Yes Has patient had a PCN reaction causing severe rash involving mucus membranes or skin necrosis: No Has patient had a PCN reaction that required hospitalization: unknown Has patient had a PCN reaction occurring within the last 10 years: unknown If all of the above answers are "NO", then may proceed with Cephalosporin use.  . Sulfa Antibiotics Rash    DISCHARGE MEDICATIONS:   Current Discharge Medication List    START taking these medications   Details  ciprofloxacin (CIPRO) 500 MG tablet Take 1 tablet (500 mg total) by mouth 2 (two) times daily. Qty: 14 tablet, Refills: 0      CONTINUE these medications which have NOT CHANGED   Details  acetaminophen (MAPAP ARTHRITIS PAIN) 650 MG CR tablet Take 650 mg by mouth 3 (three) times daily.    ALPRAZolam (XANAX) 1 MG tablet Take 0.5 mg by mouth See admin instructions. Take 1/2 tablet (0.5 mg) by mouth four times a day-Routine Take 1/2 tablet (0.5 mg) by mouth 2 times a day as needed for anxiety- prn med    Ascorbic Acid (VITAMIN C) 1000 MG tablet Take 1,000 mg by mouth every Monday, Wednesday, and Friday.    aspirin EC 81 MG tablet Take 81 mg by mouth daily.    cholecalciferol (VITAMIN D) 1000  units tablet Take 1,000 Units by mouth daily.    !! citalopram (CELEXA) 10 MG tablet Take 10 mg by mouth every other day. *Alternating with 20 mg dose*    !! citalopram (CELEXA) 20 MG tablet Take 20 mg by mouth every other day. *Alternating with 10 mg dose*    fexofenadine (ALLEGRA) 180 MG tablet Take 180 mg by mouth daily as needed for allergies or rhinitis.    guaifenesin (TUSSIN) 100 MG/5ML syrup Take 200 mg by mouth every 4 (four) hours as needed for cough or  congestion.    Hypromellose (ARTIFICIAL TEARS OP) Apply 2 drops to eye every 2 (two) hours as needed. For dry eyes.    latanoprost (XALATAN) 0.005 % ophthalmic solution Place 1 drop into both eyes every evening.    Multiple Vitamins-Minerals (PRESERVISION AREDS) CAPS Take 1 capsule by mouth 2 (two) times daily.    Multiple Vitamins-Minerals (SENTRY SENIOR) TABS Take 1 tablet by mouth daily.    pantoprazole (PROTONIX) 40 MG tablet Take 40 mg by mouth 2 (two) times daily.    POLYETHYLENE GLYCOL 3350 PO Take 17 g by mouth daily. Mix in 8 oz. of water.    QUEtiapine (SEROQUEL) 25 MG tablet Take 25 mg by mouth 3 (three) times daily.    timolol (TIMOPTIC) 0.5 % ophthalmic solution Place 1 drop into both eyes every morning.     !! - Potential duplicate medications found. Please discuss with provider.    STOP taking these medications     carbamide peroxide (DEBROX) 6.5 % otic solution               Today   CHIEF COMPLAINT:  No issues over night Daughter reports that patient is mostly bed bound   VITAL SIGNS:  Blood pressure 99/81, pulse 88, temperature 98.1 F (36.7 C), temperature source Oral, resp. rate 14, height 5\' 1"  (1.549 m), weight 57.5 kg (126 lb 12.8 oz), SpO2 96 %.   REVIEW OF SYSTEMS:  Review of Systems  Constitutional: Negative.  Negative for chills, fever and malaise/fatigue.  HENT: Positive for hearing loss. Negative for ear discharge, ear pain, nosebleeds and sore throat.   Eyes: Negative.  Negative for blurred vision and pain.  Respiratory: Negative.  Negative for cough, hemoptysis, shortness of breath and wheezing.   Cardiovascular: Negative.  Negative for chest pain, palpitations and leg swelling.  Gastrointestinal: Negative.  Negative for abdominal pain, blood in stool, diarrhea, nausea and vomiting.  Genitourinary: Negative.  Negative for dysuria.  Musculoskeletal: Negative.  Negative for back pain.  Skin: Negative.   Neurological: Negative for  dizziness, tremors, speech change, focal weakness, seizures and headaches.  Endo/Heme/Allergies: Negative.  Does not bruise/bleed easily.  Psychiatric/Behavioral: Positive for memory loss. Negative for depression, hallucinations and suicidal ideas.     PHYSICAL EXAMINATION:  GENERAL:  [age] y.o.-year-old patient lying in the bed with no acute distress.  NECK:  Supple, no jugular venous distention. No thyroid enlargement, no tenderness.  LUNGS: Normal breath sounds bilaterally, no wheezing, rales,rhonchi  No use of accessory muscles of respiration.  CARDIOVASCULAR: S1, S2 normal. No murmurs, rubs, or gallops.  ABDOMEN: Soft, non-tender, non-distended. Bowel sounds present. No organomegaly or mass.  EXTREMITIES: No pedal edema, cyanosis, or clubbing.  PSYCHIATRIC: The patient is alert and oriented x name not place or time SKIN: sebhorric keratosis on face  DATA REVIEW:   CBC  Recent Labs Lab 01/02/16 0721  WBC 8.6  HGB 10.7*  HCT 31.6*  PLT 89*  Chemistries   Recent Labs Lab 12/31/15 1146 01/01/16 0557  NA 141 143  K 4.4 4.5  CL 109 109  CO2 21* 23  GLUCOSE 184* 162*  BUN 32* 30*  CREATININE 1.08* 1.11*  CALCIUM 9.4 9.2  MG 1.9  --   AST 25  --   ALT 15  --   ALKPHOS 98  --   BILITOT 1.0  --     Cardiac Enzymes  Recent Labs Lab 12/31/15 1146  TROPONINI <0.03    Microbiology Results  @MICRORSLT48 @  RADIOLOGY:  Ct Head Wo Contrast  Result Date: 12/31/2015 CLINICAL DATA:  Acute mental status change and rash. History of skin cancer. EXAM: CT HEAD WITHOUT CONTRAST TECHNIQUE: Contiguous axial images were obtained from the base of the skull through the vertex without intravenous contrast. COMPARISON:  November 07, 2013 FINDINGS: Brain: No subdural, epidural, or subarachnoid hemorrhage. Cerebellum, brainstem, and basal cisterns are normal. Scattered white matter changes are again identified. No acute cortical ischemia or infarct. Ventricles and sulci are prominent  but stable. Vascular: Calcifications are seen in the intracranial portions of the carotid artery. Skull: Normal. Negative for fracture or focal lesion. Sinuses/Orbits: No acute finding. Other: Skin thickening and irregularity of the scout anteriorly and superiorly. Recommend clinical correlation. IMPRESSION: 1. No acute intracranial process. 2. Skin thickening near the vertex of the scalp, particularly on the left. Recommend clinical correlation. Electronically Signed   By: Gerome Sam III M.D   On: 12/31/2015 11:29      Management plans discussed with the patient's daughter and she is in agreement. Stable for discharge SNF  Patient should follow up with PCP  CODE STATUS:     Code Status Orders        Start     Ordered   12/31/15 2105  Do not attempt resuscitation (DNR)  Continuous    Question Answer Comment  In the event of cardiac or respiratory ARREST Do not call a "code blue"   In the event of cardiac or respiratory ARREST Do not perform Intubation, CPR, defibrillation or ACLS   In the event of cardiac or respiratory ARREST Use medication by any route, position, wound care, and other measures to relive pain and suffering. May use oxygen, suction and manual treatment of airway obstruction as needed for comfort.   Comments Confirmed with patient's daughter/proxy.  Copy of DNR on chart      12/31/15 2104    Code Status History    Date Active Date Inactive Code Status Order ID Comments User Context   12/31/2015  9:04 PM 12/31/2015  9:04 PM Full Code [id number]  Tonye Royalty, DO Inpatient    Advance Directive Documentation   Flowsheet Row Most Recent Value  Type of Advance Directive  Out of facility DNR (pink MOST or yellow form)  Pre-existing out of facility DNR order (yellow form or pink MOST form)  No data  "MOST" Form in Place?  No data      TOTAL TIME TAKING CARE OF THIS PATIENT: 38 minutes.   Rounded with nurse Note: This dictation was prepared with Dragon  dictation along with smaller phrase technology. Any transcriptional errors that result from this process are unintentional.  Trellis Vanoverbeke M.D on 01/02/2016 at 9:40 AM  Between 7am to 6pm - Pager - [phone] After 6pm go to [email or web address] - Social research officer, government  [facility]  Office  [phone]  CC: Primary care physician; Tillman Abide, MD

## 2016-01-03 DIAGNOSIS — A419 Sepsis, unspecified organism: Secondary | ICD-10-CM | POA: Diagnosis not present

## 2016-01-10 NOTE — ED Provider Notes (Signed)
[facility] Emergency Department Provider Note   ____________________________________________   I have reviewed the triage vital signs and the nursing notes.   HISTORY  Chief Complaint Altered Mental Status   History limited by: Not Limited   HPI Vanessa Glass is a 82 y.o. female who presents from living facility today because of an episode of infusion earlier. The patient herself states that she feels okay and states that she did not want to come to the emergency department. She denies any pain or fevers.    Past Medical History:  Diagnosis Date  . Anemia   . Anxiety   . Barrett's esophagus   . GERD (gastroesophageal reflux disease)   . Hypertension   . Renal disorder     Patient Active Problem List   Diagnosis Date Noted  . Sepsis secondary to UTI (HCC) 12/31/2015    History reviewed. No pertinent surgical history. Allergies Erythromycin; Furadantin [nitrofurantoin]; Keflex [cephalexin]; Macrolides and ketolides; Penicillins; and Sulfa antibiotics  History reviewed. No pertinent family history.  Social History Social History  Substance Use Topics  . Smoking status: Never Smoker  . Smokeless tobacco: Never Used  . Alcohol use No    Review of Systems  Constitutional: Negative for fever. Cardiovascular: Negative for chest pain. Respiratory: Negative for shortness of breath. Gastrointestinal: Negative for abdominal pain, vomiting and diarrhea. Neurological: Negative for headaches, focal weakness or numbness.  10-point ROS otherwise negative.  ____________________________________________   PHYSICAL EXAM:  VITAL SIGNS: ED Triage Vitals  Enc Vitals Group     BP 12/31/15 1018 (!) 163/146     Pulse Rate 12/31/15 1018 95     Resp 12/31/15 1018 18     Temp 12/31/15 1018 98.1 F (36.7 C)     Temp Source 12/31/15 1018 Axillary     SpO2 12/31/15 1017 94 %     Weight 12/31/15 1019 131 lb 9.6 oz (59.7 kg)     Height 12/31/15  1019 5\' 2"  (1.575 m)     Head Circumference --      Peak Flow --      Pain Score 01/01/16 1234 Asleep   Constitutional: Awake and alert. No acute distress. Eyes: Conjunctivae are normal. Normal extraocular movements. ENT   Head: Normocephalic and atraumatic.   Nose: No congestion/rhinnorhea.   Mouth/Throat: Mucous membranes are moist.   Neck: No stridor. Hematological/Lymphatic/Immunilogical: No cervical lymphadenopathy. Cardiovascular: Normal rate, regular rhythm.  No murmurs, rubs, or gallops. Respiratory: Normal respiratory effort without tachypnea nor retractions. Breath sounds are clear and equal bilaterally. No wheezes/rales/rhonchi. Gastrointestinal: Soft and nontender. No distention.  Genitourinary: Deferred Musculoskeletal: Normal range of motion in all extremities. No lower extremity edema. Neurologic:  Awake, alert. Moving all extremities.  Psychiatric: Mood and affect are normal. Speech and behavior are normal. Patient exhibits appropriate insight and judgment.  ____________________________________________    LABS (pertinent positives/negatives)  UA WBC too numerous to count WBC 14.0  ____________________________________________   EKG  None  ____________________________________________    RADIOLOGY  CT head   IMPRESSION:  1. No acute intracranial process.  2. Skin thickening near the vertex of the scalp, particularly on the  left. Recommend clinical correlation.      ____________________________________________   PROCEDURES  Procedures  ____________________________________________   INITIAL IMPRESSION / ASSESSMENT AND PLAN / ED COURSE  Pertinent labs & imaging results that were available during my care of the patient were reviewed by me and considered in my medical decision making (see chart for details).  Patient presents to the emergency department today after an episode of confusion. Head CT negative. Urine was concerning  for UTI. Will give patient some IV antibiotics and emergency department ____________________________________________   FINAL CLINICAL IMPRESSION(S) / ED DIAGNOSES  Final diagnoses:  UTI (lower urinary tract infection)  Altered mental status, unspecified altered mental status type  Non-intractable vomiting with nausea, vomiting of unspecified type     Note: This dictation was prepared with Dragon dictation. Any transcriptional errors that result from this process are unintentional    Phineas SemenGraydon Jalicia Roszak, MD 01/10/16 [phone]

## 2016-01-24 DIAGNOSIS — M1990 Unspecified osteoarthritis, unspecified site: Secondary | ICD-10-CM | POA: Diagnosis not present

## 2016-01-24 DIAGNOSIS — F29 Unspecified psychosis not due to a substance or known physiological condition: Secondary | ICD-10-CM

## 2016-01-24 DIAGNOSIS — I4891 Unspecified atrial fibrillation: Secondary | ICD-10-CM | POA: Diagnosis not present

## 2016-01-24 DIAGNOSIS — K2270 Barrett's esophagus without dysplasia: Secondary | ICD-10-CM | POA: Diagnosis not present

## 2016-01-24 DIAGNOSIS — F39 Unspecified mood [affective] disorder: Secondary | ICD-10-CM | POA: Diagnosis not present

## 2016-02-02 DIAGNOSIS — F29 Unspecified psychosis not due to a substance or known physiological condition: Secondary | ICD-10-CM | POA: Diagnosis not present

## 2016-02-21 DEATH — deceased

## 2016-04-07 IMAGING — CR DG ANKLE COMPLETE 3+V*L*
1 series · 3 of 3 positions shown · non-contrast
Comparison: Left ankle x-rays earlier same date 5970 hr.

CLINICAL DATA: Post reduction bimalleolar left ankle fracture.

EXAM:
LEFT ANKLE COMPLETE - 3+ VIEW 4645 hr

[Series 1: ap · 0.17mm/px · 3 of 3 slices shown]
[im 1/3]
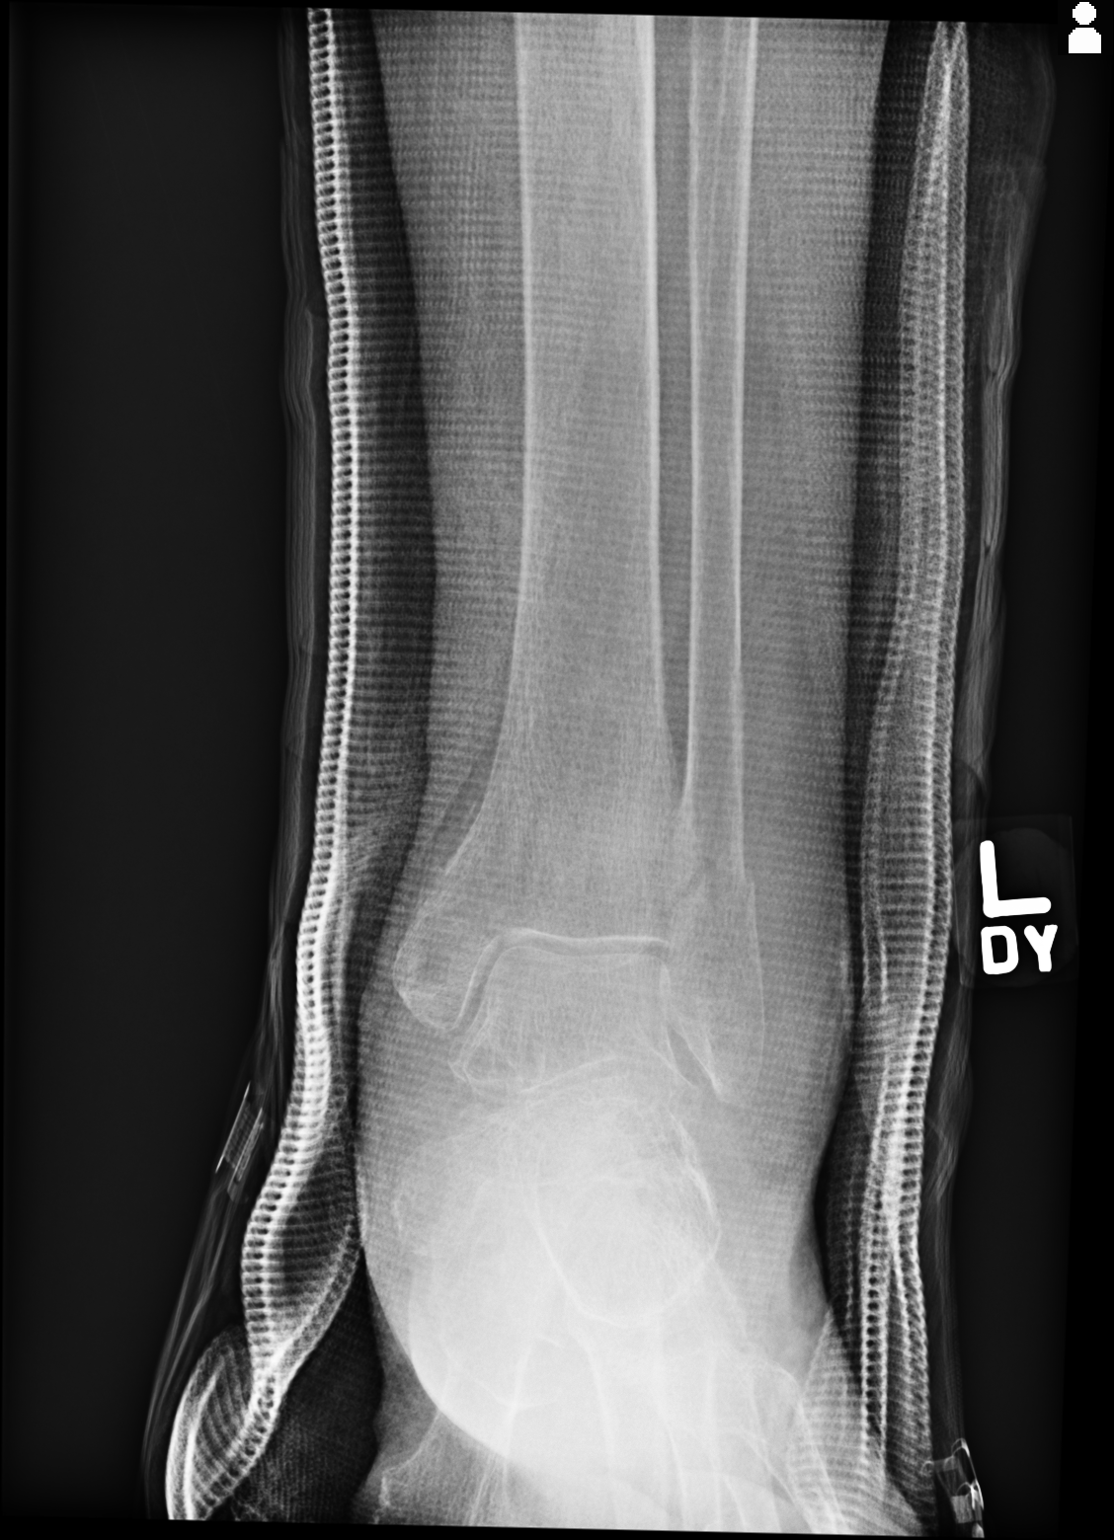
[im 2/3]
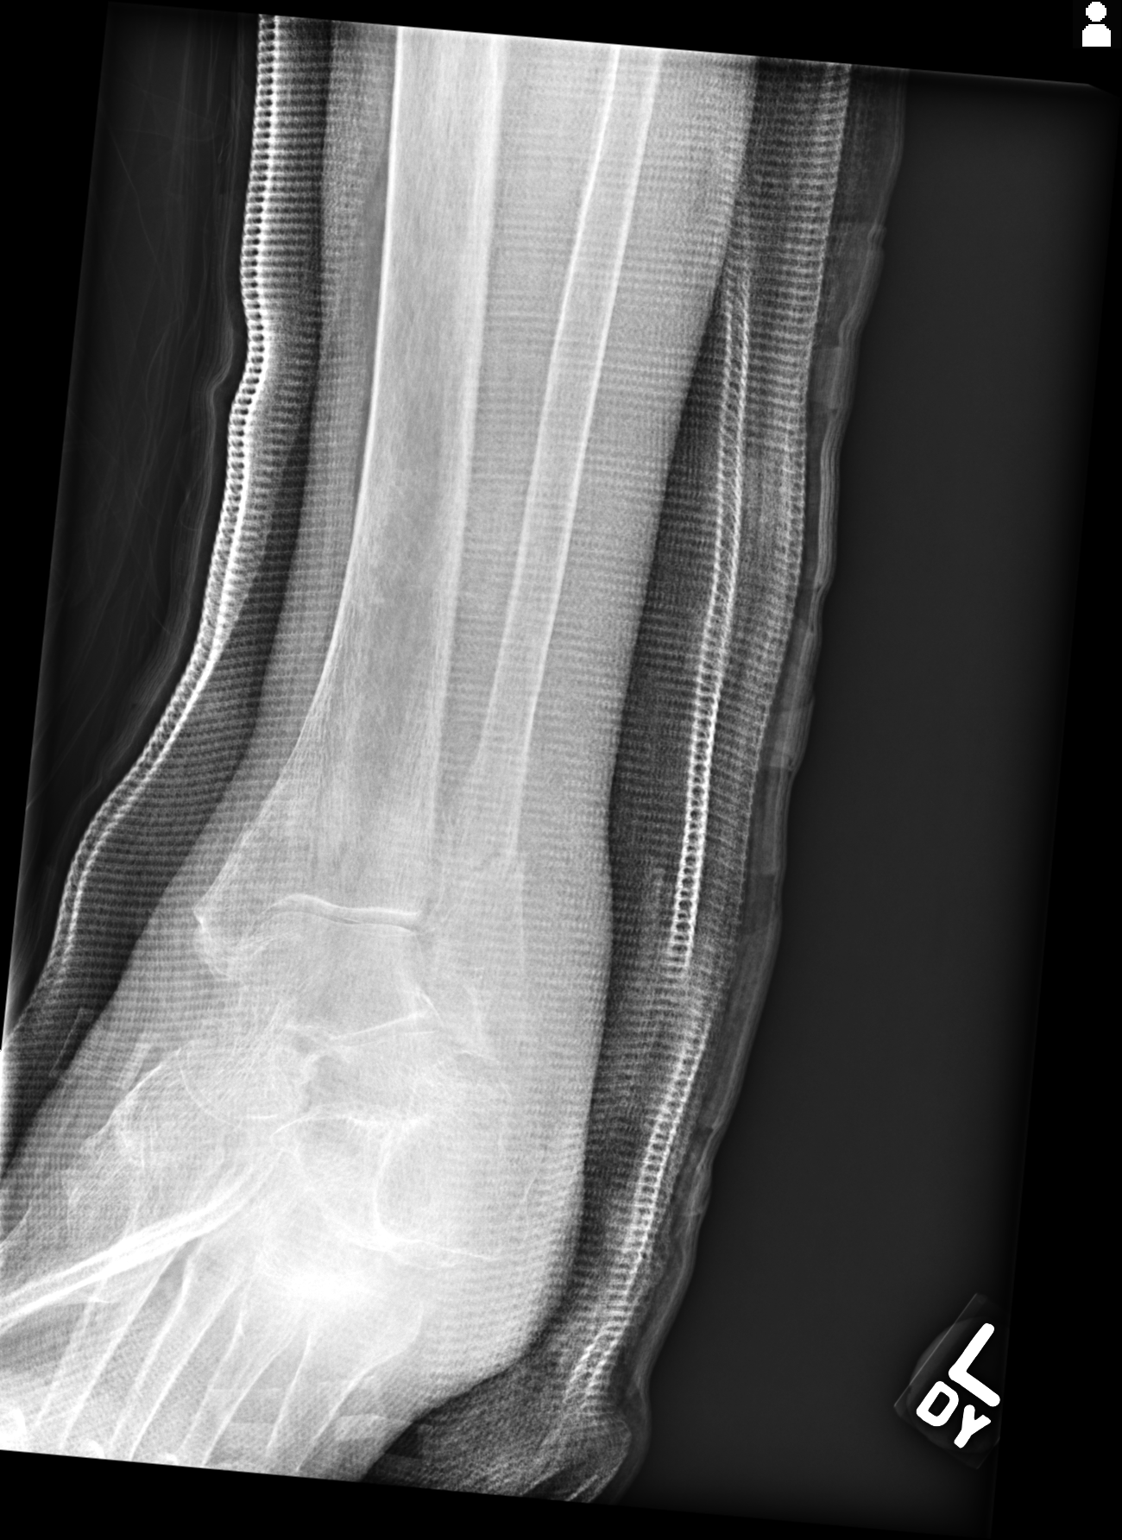
[im 3/3]
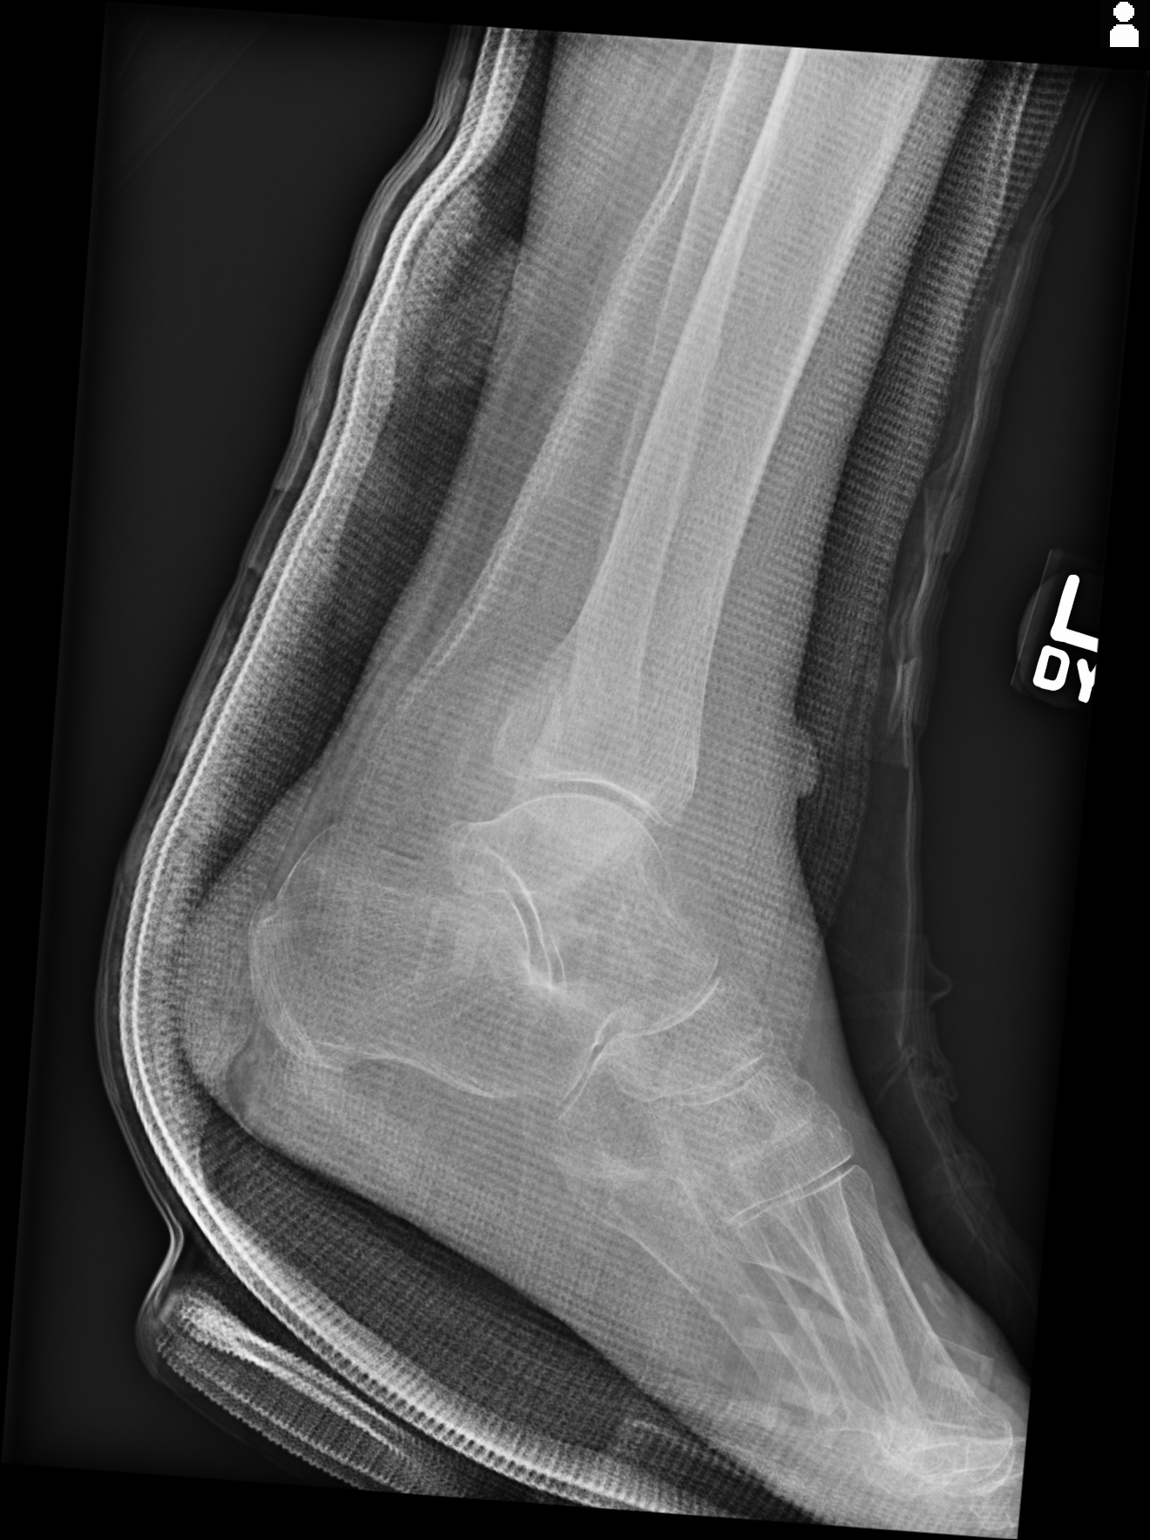

[3 of 3 positions shown; findings below may reference images not displayed]

FINDINGS: Examination was performed in fiberglass cast material. Significant
improvement in alignment of the fractures involving the distal
fibula and the medial malleolus. Ankle mortise now intact with
anatomic alignment.
IMPRESSION: Significant improvement in alignment of the bimalleolar fractures
post reduction.

## 2016-04-07 IMAGING — CT CT CERVICAL SPINE WITHOUT CONTRAST
4 of 6 series · 15 of 33 positions shown, 17 images · non-contrast
Comparison: None.

CLINICAL DATA: Fall with head injury.

EXAM:
CT HEAD WITHOUT CONTRAST
CT CERVICAL SPINE WITHOUT CONTRAST
TECHNIQUE: Multidetector CT imaging of the head and cervical spine was
performed following the standard protocol without intravenous
contrast. Multiplanar CT image reconstructions of the cervical spine
were also generated.

[Series 5: soft tissue · axial · 0.40mm/px · z∈[-120,-38]mm · 3 of 83 slices shown]
[im 21/83  soft-tissue]
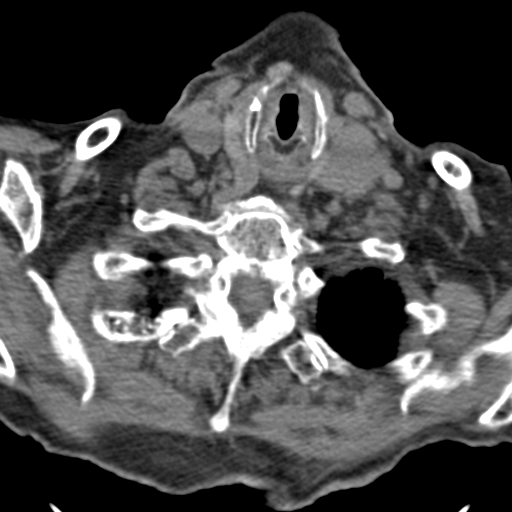
[im 42/83  soft-tissue]
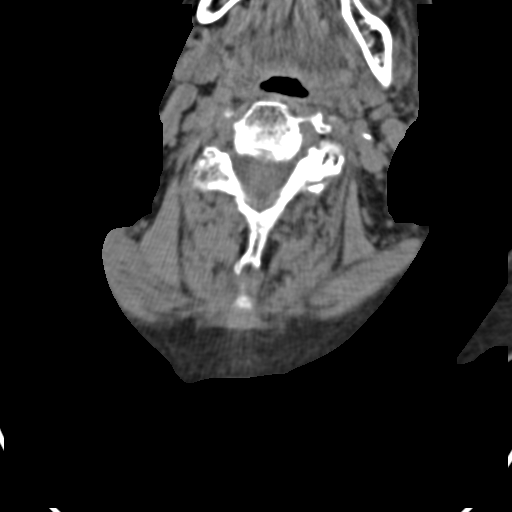
[im 62/83  soft-tissue]
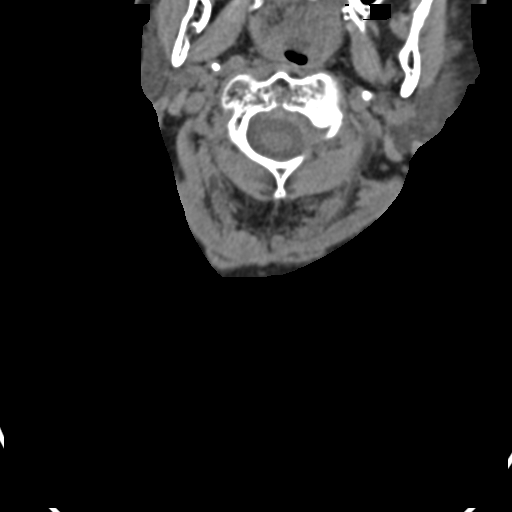

[Series 10: sagittal bone · sagittal · 0.31mm/px · 5 of 62 slices shown, 6 images]
[im 21/62  bone]
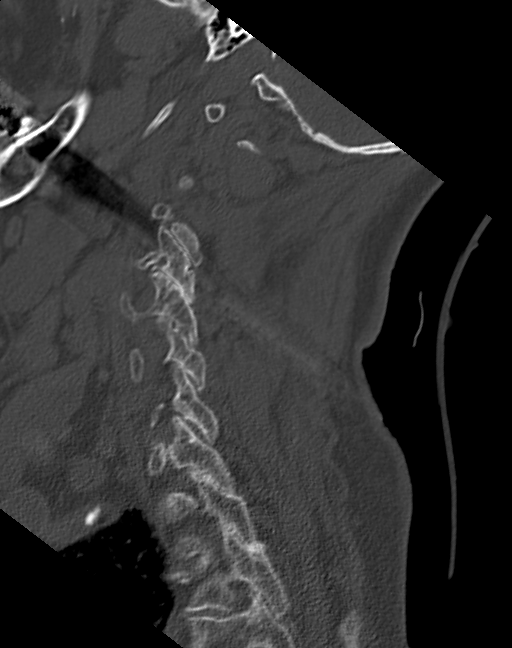
[im 26/62  bone]
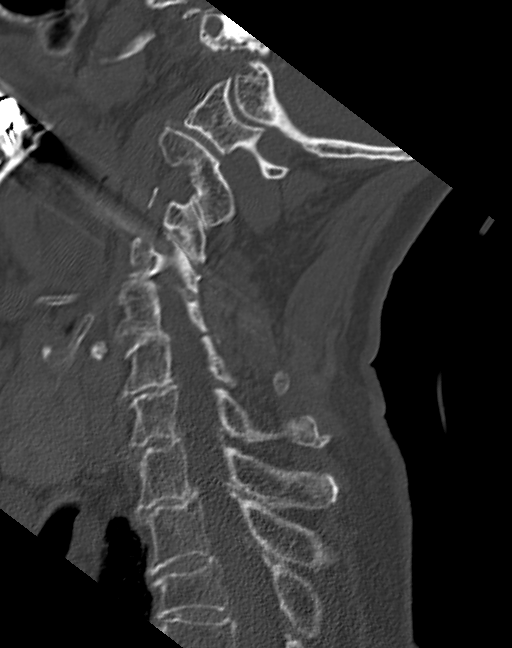
[im 31/62  soft-tissue]
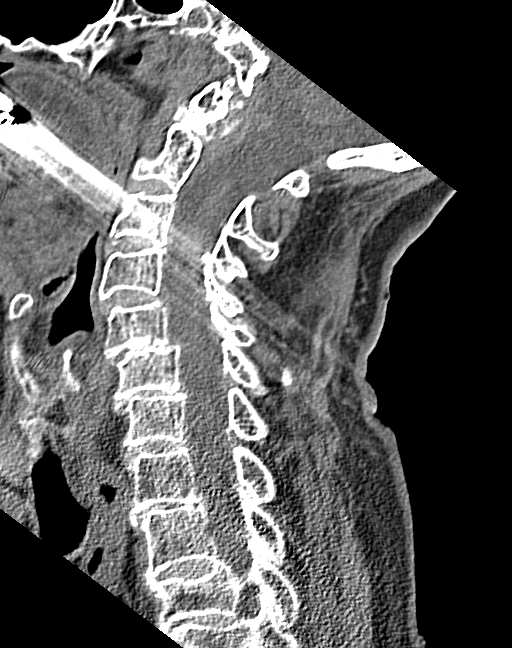
[im 31/62  bone]
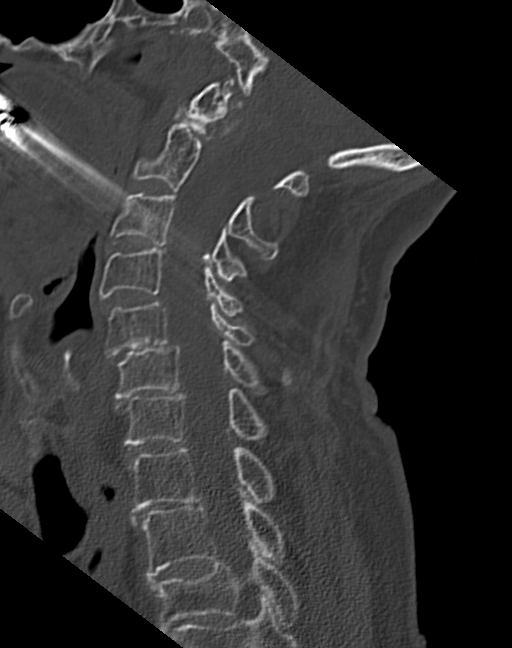
[im 36/62  bone]
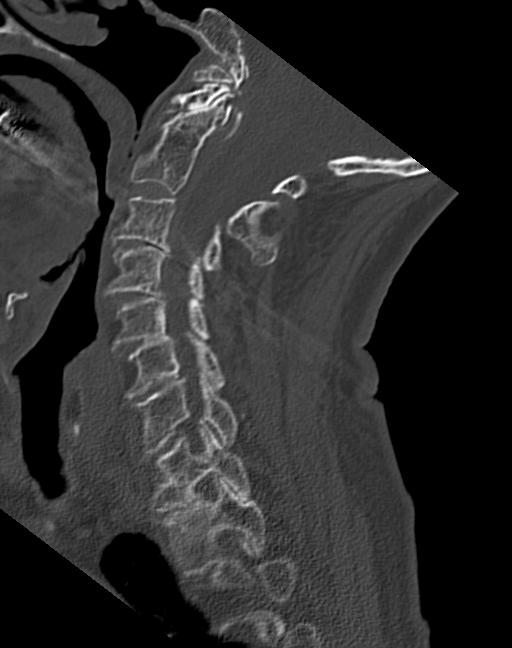
[im 41/62  bone]
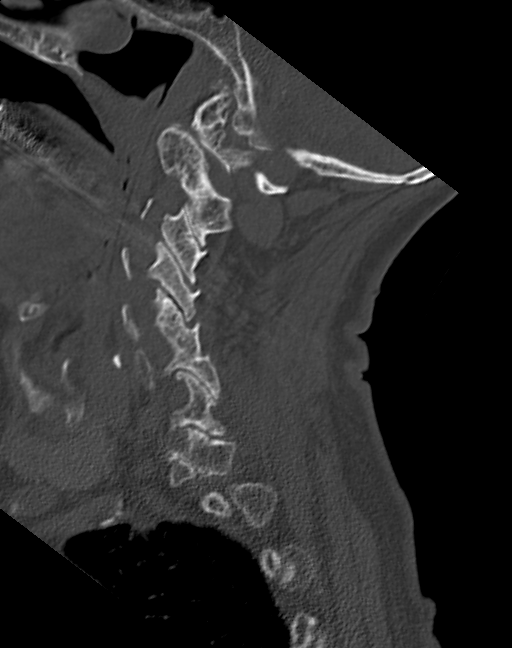

[Series 11: coronal bone · coronal · 0.35mm/px · 3 of 59 slices shown]
[im 16/59  bone]
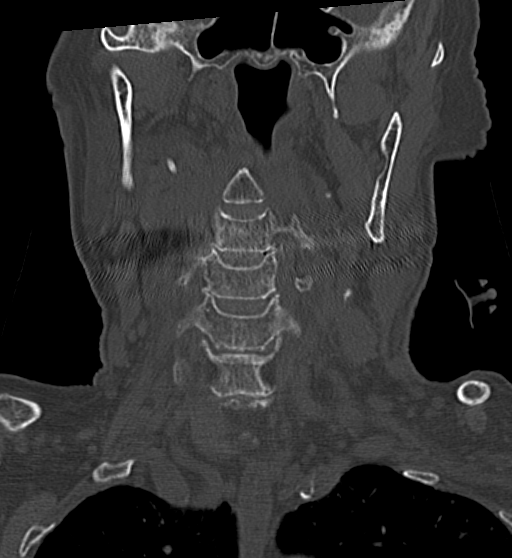
[im 25/59  bone]
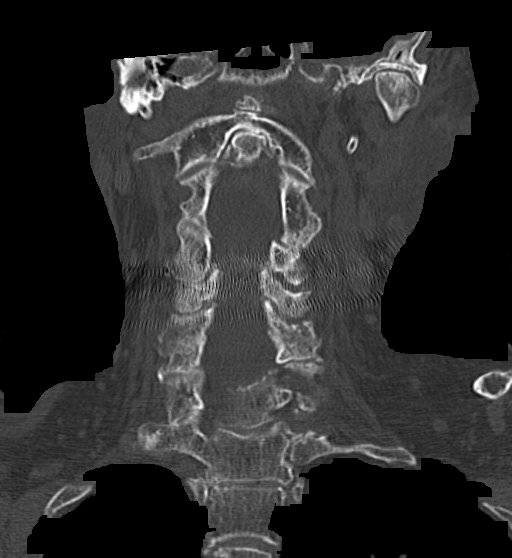
[im 34/59  bone]
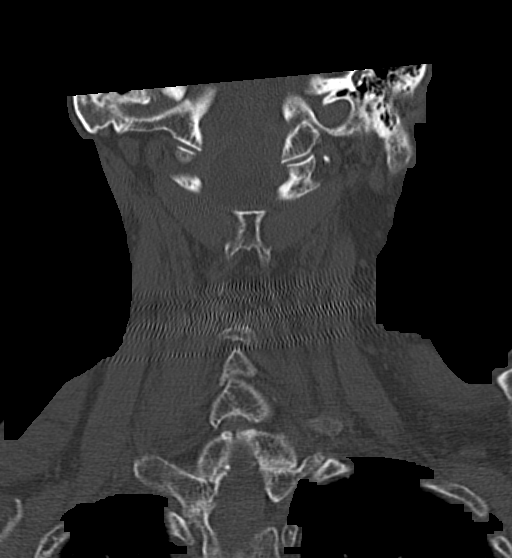

[Series 12: axial · axial · 0.23mm/px · z∈[-150,-52]mm · 4 of 100 slices shown, 5 images]
[im 20/100  soft-tissue]
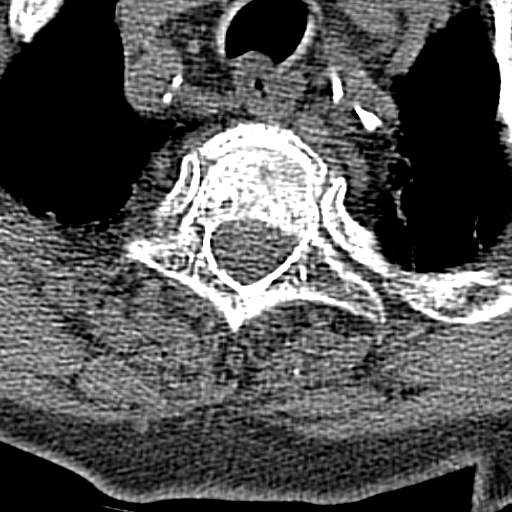
[im 20/100  bone]
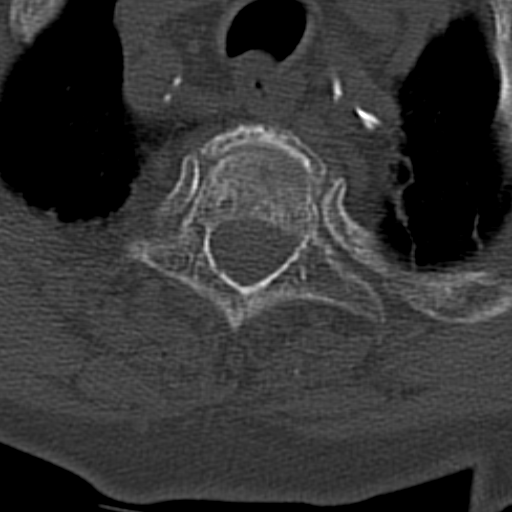
[im 40/100  bone]
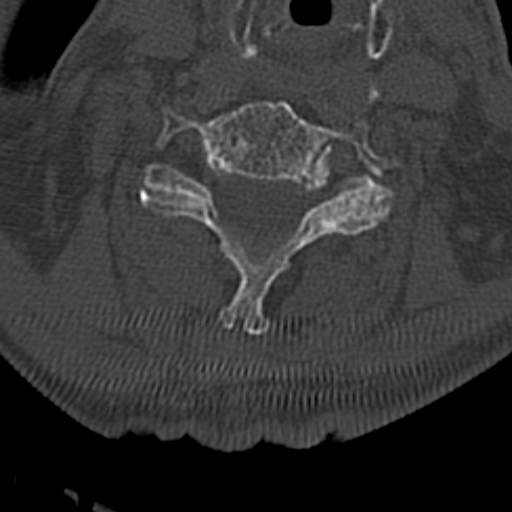
[im 60/100  bone]
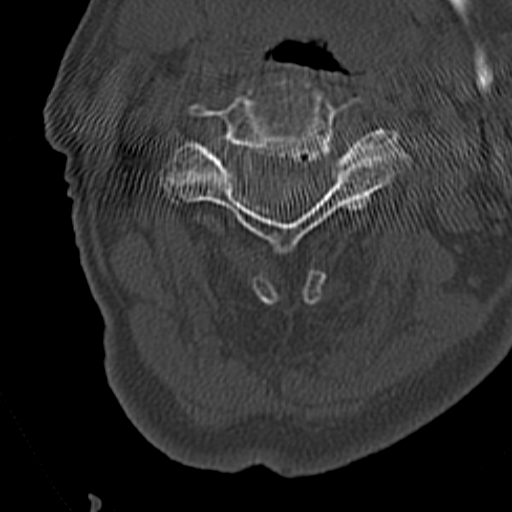
[im 80/100  bone]
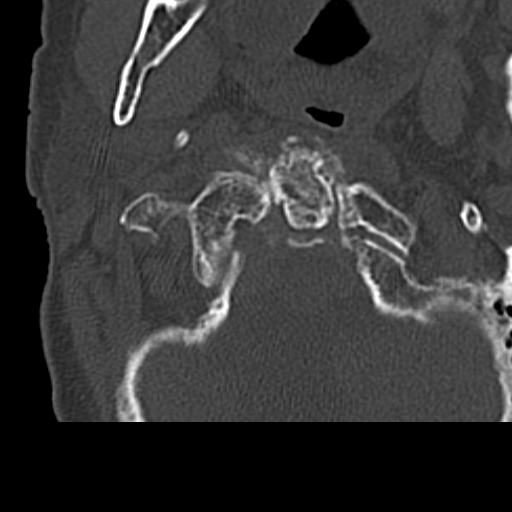

[15 of 33 positions shown; findings below may reference images not displayed]

FINDINGS: CT HEAD FINDINGS

The brain shows evidence of age appropriate atrophy and mild to
moderate small vessel disease in the periventricular white matter.
The brain demonstrates no evidence of hemorrhage, infarction, edema,
mass effect, extra-axial fluid collection, hydrocephalus or mass
lesion. The skull is unremarkable.

CT CERVICAL SPINE FINDINGS

The cervical spine shows normal alignment. There is no evidence of
acute fracture or subluxation. No soft tissue swelling or hematoma
is identified. The cervical spine shows mild to moderate spondylosis
with a mild anterolisthesis of C5 on C6. There is underlying
osteopenia. No bony or soft tissue lesions are seen. The visualized
airway is normally patent.
IMPRESSION: 1. Atrophy and small vessel disease of the brain. No acute findings
by head CT.
2. No evidence of cervical spine fracture. Degenerative changes are
present throughout the cervical spine.

## 2016-04-08 IMAGING — CR DG ABDOMEN 3V
1 series · 4 of 4 positions shown · non-contrast
Comparison: None.

CLINICAL DATA: Possible ileus

EXAM:
ABDOMEN SERIES

[Series 1: x abdomen supine · 0.14mm/px · 4 of 4 slices shown]
[im 1/4]
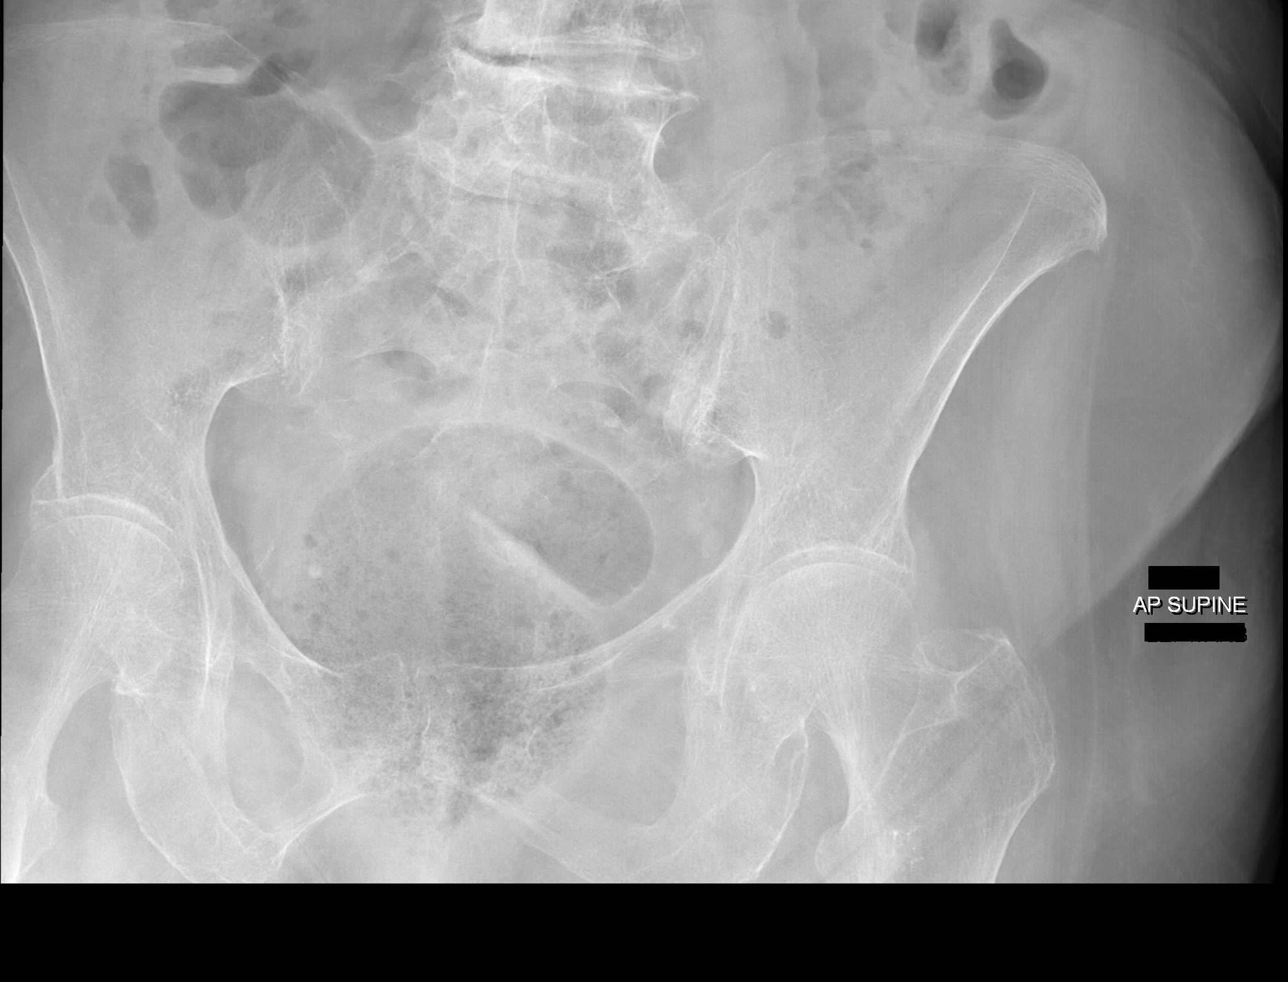
[im 2/4]
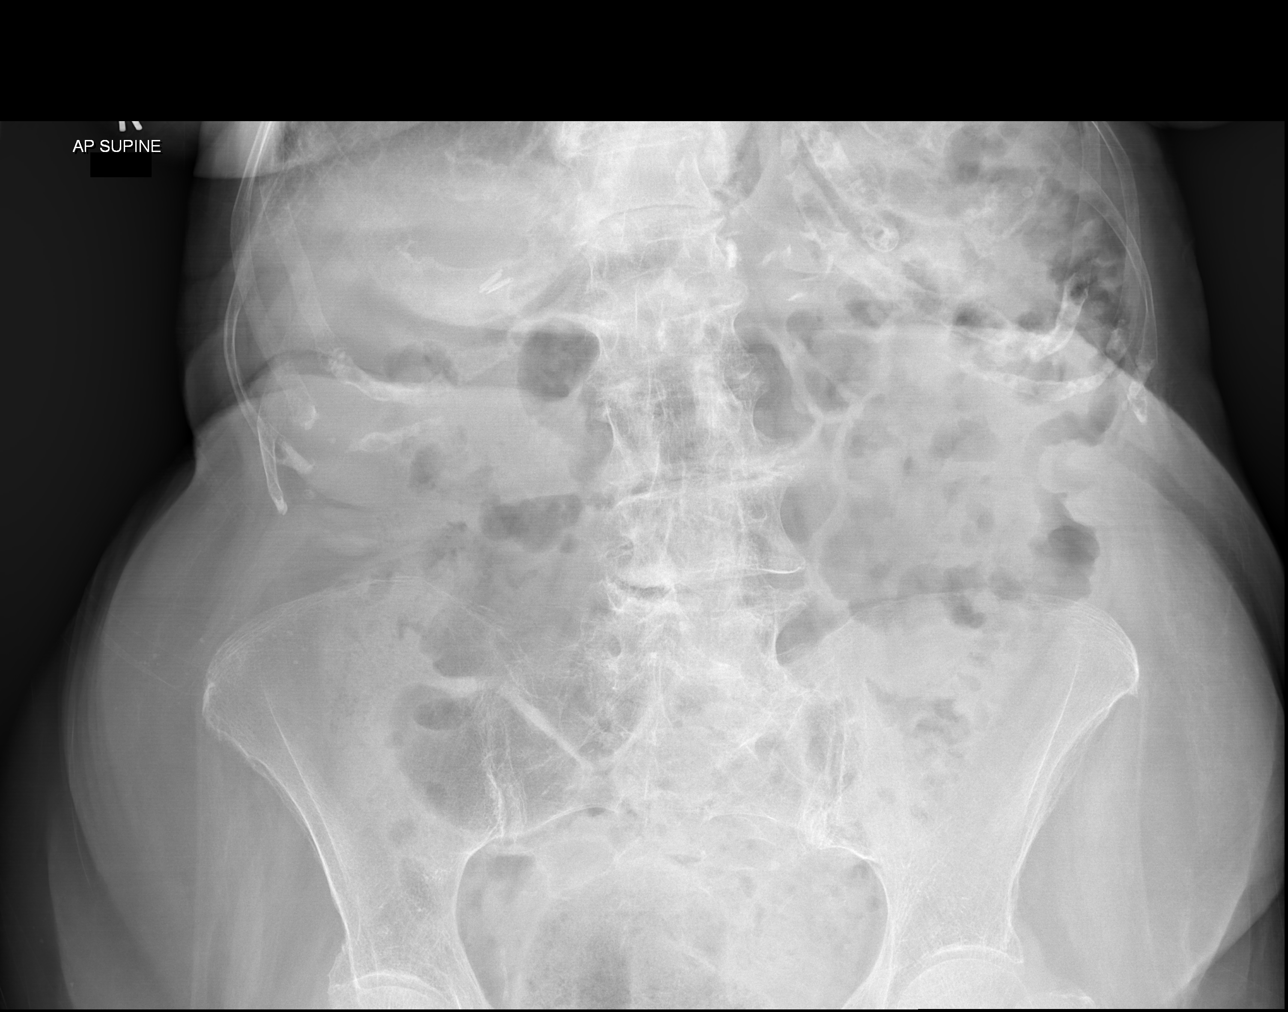
[im 3/4]
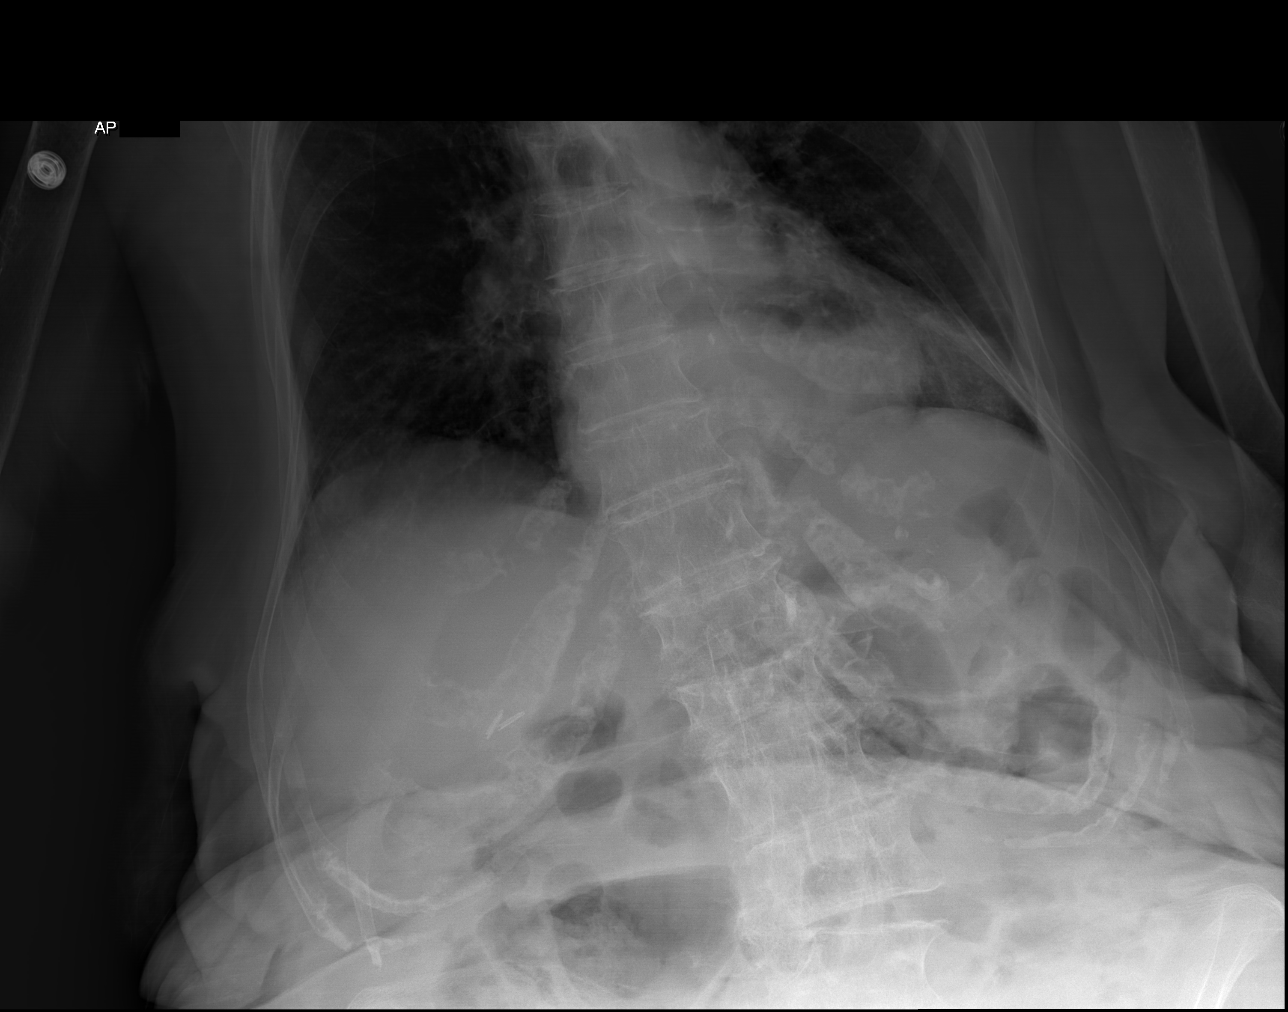
[im 4/4]
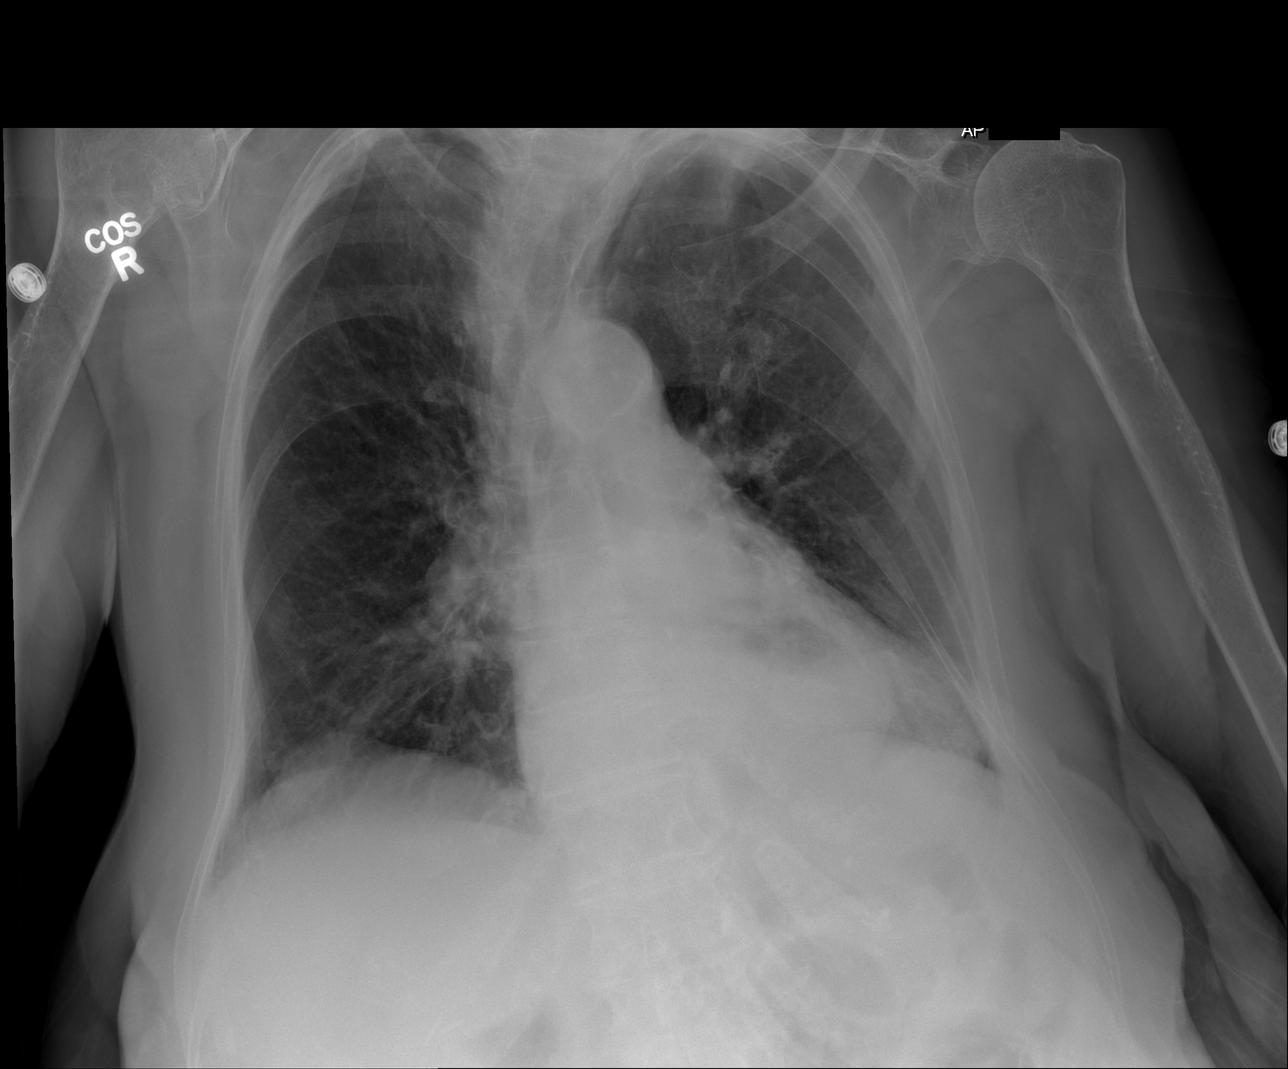

[4 of 4 positions shown; findings below may reference images not displayed]

FINDINGS: Moderate hiatal hernia. Heart size upper normal. Vascular pattern
normal and lungs clear. No suggestion of free air.

No small bowel dilatation. Mild gaseous distension of the cecum.
Rectum is distended with stool to a diameter of 9 cm.
IMPRESSION: Fecal impaction in the rectum.  Nonobstructive bowel gas pattern.

## 2016-04-09 IMAGING — CR DG ABDOMEN 1V
1 series · 2 of 2 positions shown · non-contrast
Comparison: November 16, 2013.

CLINICAL DATA: Impaction.

EXAM:
ABDOMEN - 1 VIEW

[Series 8: x abdomen supine · 0.14mm/px · 2 of 2 slices shown]
[im 1/2]
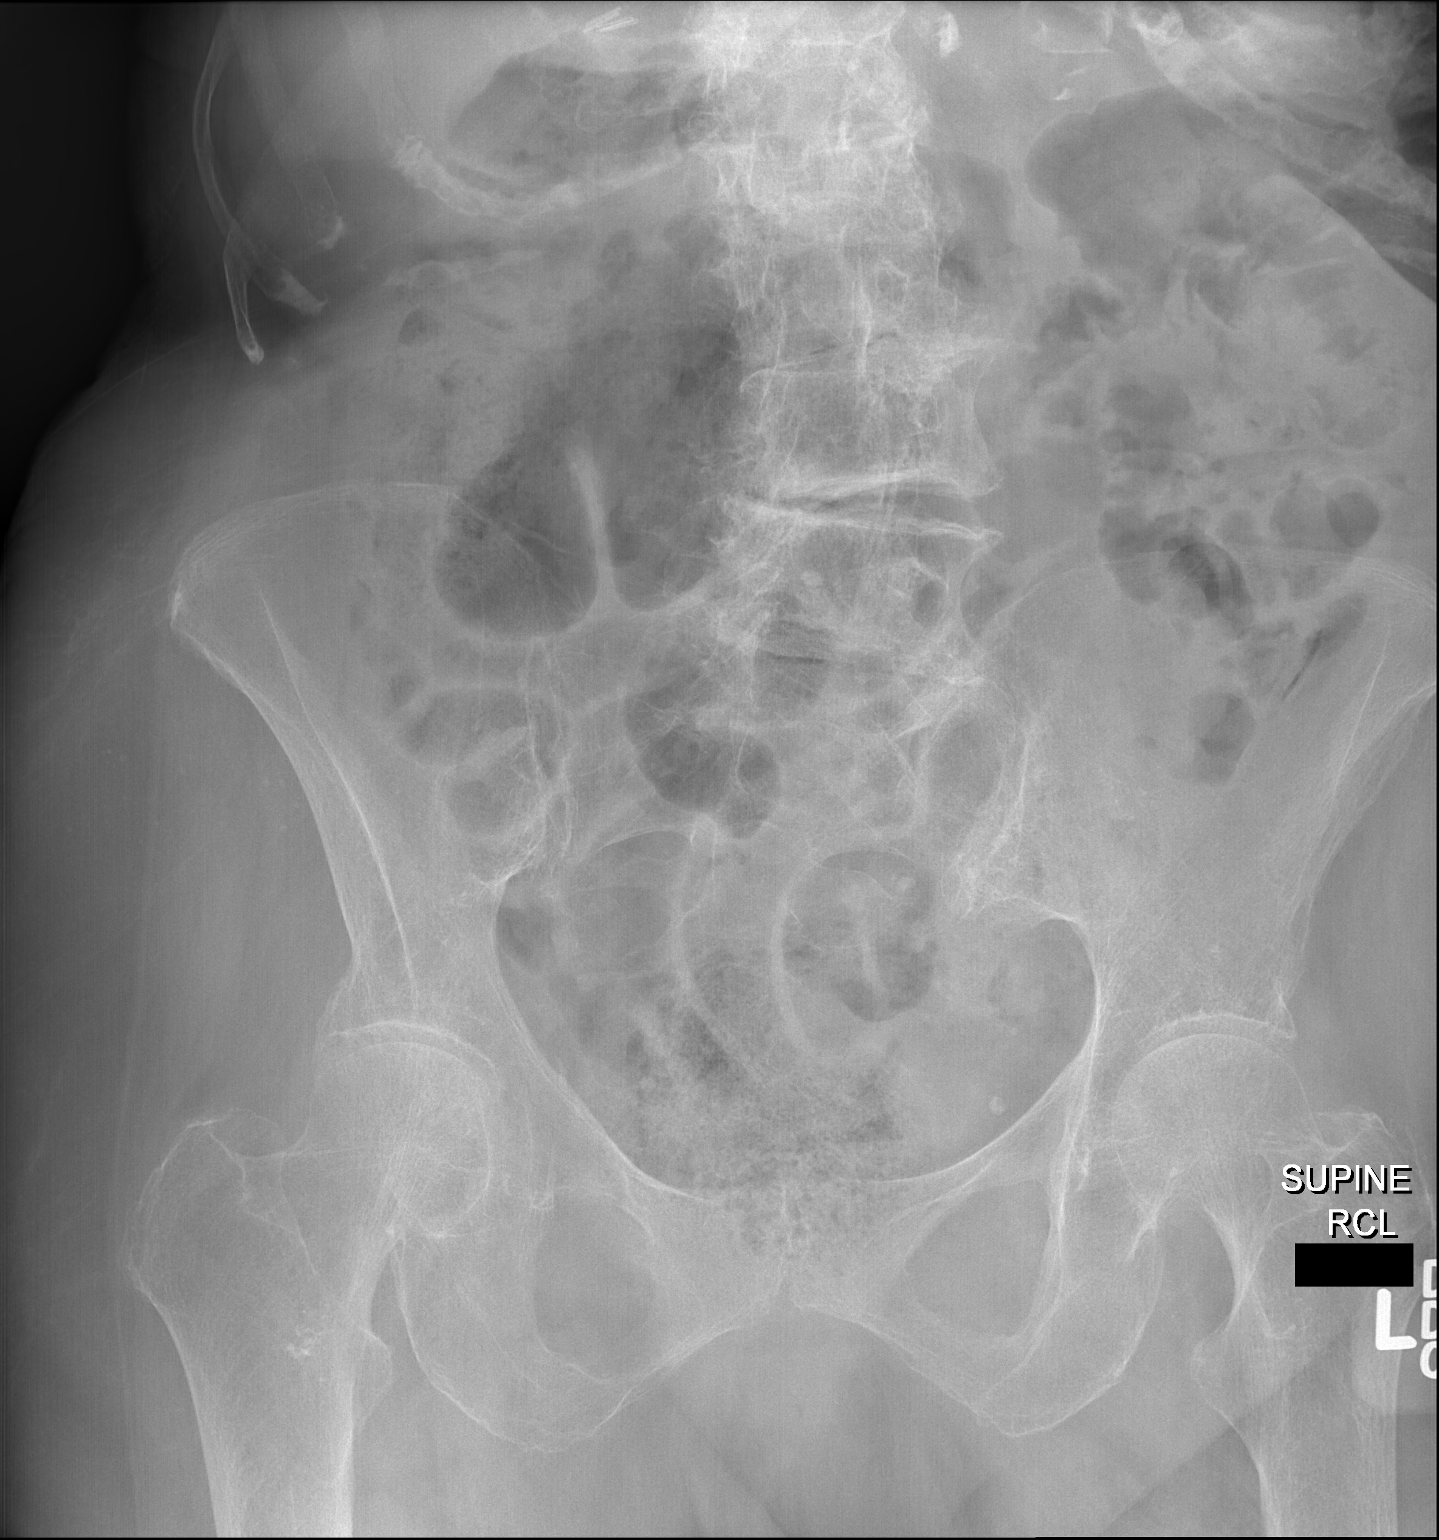
[im 2/2]
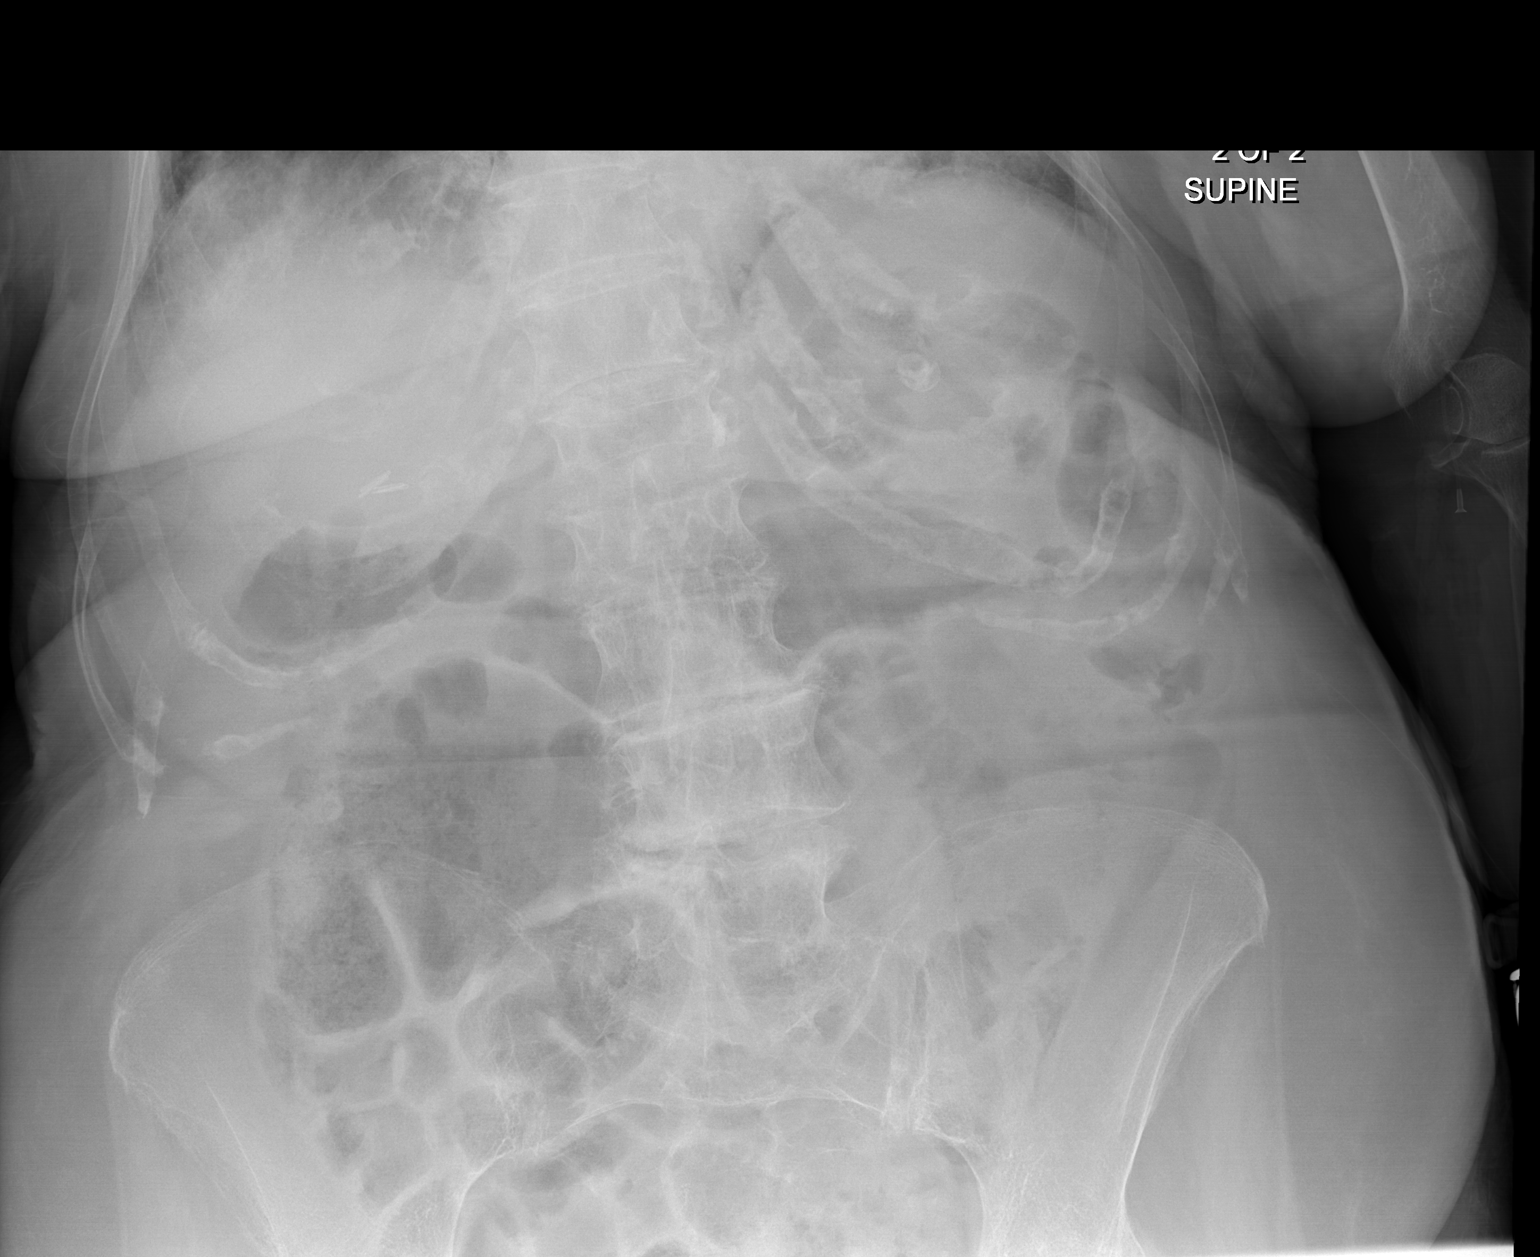

[2 of 2 positions shown; findings below may reference images not displayed]

FINDINGS: There is no evidence of bowel obstruction or ileus. The amount of
stool present within the rectum is significantly smaller. It now
measures 6 cm in diameter. Degenerative changes are noted in the
lumbar spine. Status post cholecystectomy. Phleboliths are noted in
the pelvis.
IMPRESSION: No evidence of bowel obstruction or ileus. Amount of stool present
in rectum is significantly decreased compared to prior exam.
